# Patient Record
Sex: Male | Born: 1960
Health system: Southern US, Community
[De-identification: ages and names within clinical notes are randomized; demographics above are authoritative.]

## PROBLEM LIST (undated history)

## (undated) DIAGNOSIS — Z923 Personal history of irradiation: Secondary | ICD-10-CM

## (undated) DIAGNOSIS — C801 Malignant (primary) neoplasm, unspecified: Secondary | ICD-10-CM

## (undated) HISTORY — DX: Personal history of irradiation: Z92.3

## (undated) HISTORY — PX: OTHER SURGICAL HISTORY: SHX169

## (undated) HISTORY — PX: HERNIA REPAIR: SHX51

---

## 2002-04-26 ENCOUNTER — Encounter: Payer: Self-pay | Admitting: Internal Medicine

## 2005-10-14 ENCOUNTER — Emergency Department (HOSPITAL_COMMUNITY): Admission: EM | Admit: 2005-10-14 | Discharge: 2005-10-14 | Payer: Self-pay | Admitting: Family Medicine

## 2006-05-05 ENCOUNTER — Ambulatory Visit: Payer: Self-pay | Admitting: Internal Medicine

## 2006-05-13 ENCOUNTER — Ambulatory Visit: Payer: Self-pay | Admitting: Internal Medicine

## 2006-06-13 ENCOUNTER — Ambulatory Visit: Payer: Self-pay | Admitting: Internal Medicine

## 2007-02-13 ENCOUNTER — Ambulatory Visit: Payer: Self-pay | Admitting: Internal Medicine

## 2007-04-13 ENCOUNTER — Telehealth (INDEPENDENT_AMBULATORY_CARE_PROVIDER_SITE_OTHER): Payer: Self-pay | Admitting: *Deleted

## 2007-04-29 DIAGNOSIS — F411 Generalized anxiety disorder: Secondary | ICD-10-CM | POA: Insufficient documentation

## 2007-10-13 DIAGNOSIS — G4700 Insomnia, unspecified: Secondary | ICD-10-CM | POA: Insufficient documentation

## 2007-10-14 ENCOUNTER — Telehealth: Payer: Self-pay | Admitting: Internal Medicine

## 2008-01-14 ENCOUNTER — Ambulatory Visit: Payer: Self-pay | Admitting: Internal Medicine

## 2008-02-16 ENCOUNTER — Ambulatory Visit: Payer: Self-pay | Admitting: Internal Medicine

## 2008-02-16 ENCOUNTER — Telehealth: Payer: Self-pay | Admitting: Internal Medicine

## 2008-02-16 DIAGNOSIS — I881 Chronic lymphadenitis, except mesenteric: Secondary | ICD-10-CM | POA: Insufficient documentation

## 2008-02-16 DIAGNOSIS — J039 Acute tonsillitis, unspecified: Secondary | ICD-10-CM | POA: Insufficient documentation

## 2008-02-16 LAB — CONVERTED CEMR LAB
Basophils Absolute: 0 10*3/uL (ref 0.0–0.1)
Calcium: 9.4 mg/dL (ref 8.4–10.5)
Eosinophils Relative: 2.6 % (ref 0.0–5.0)
GFR calc non Af Amer: 95 mL/min
Glucose, Bld: 117 mg/dL — ABNORMAL HIGH (ref 70–99)
HCT: 45 % (ref 39.0–52.0)
MCHC: 33.5 g/dL (ref 30.0–36.0)
Monocytes Absolute: 0.6 10*3/uL (ref 0.1–1.0)
Neutro Abs: 4.2 10*3/uL (ref 1.4–7.7)
Platelets: 201 10*3/uL (ref 150–400)
RBC: 4.53 M/uL (ref 4.22–5.81)
Rapid Strep: NEGATIVE
WBC: 6.6 10*3/uL (ref 4.5–10.5)

## 2008-02-19 ENCOUNTER — Telehealth: Payer: Self-pay | Admitting: *Deleted

## 2008-02-23 ENCOUNTER — Encounter: Payer: Self-pay | Admitting: *Deleted

## 2008-02-23 ENCOUNTER — Other Ambulatory Visit: Admission: RE | Admit: 2008-02-23 | Discharge: 2008-02-23 | Payer: Self-pay | Admitting: Otolaryngology

## 2008-02-29 ENCOUNTER — Encounter: Payer: Self-pay | Admitting: Internal Medicine

## 2008-03-02 ENCOUNTER — Ambulatory Visit (HOSPITAL_BASED_OUTPATIENT_CLINIC_OR_DEPARTMENT_OTHER): Admission: RE | Admit: 2008-03-02 | Discharge: 2008-03-02 | Payer: Self-pay | Admitting: Otolaryngology

## 2008-03-02 ENCOUNTER — Encounter (INDEPENDENT_AMBULATORY_CARE_PROVIDER_SITE_OTHER): Payer: Self-pay | Admitting: Otolaryngology

## 2008-03-08 ENCOUNTER — Ambulatory Visit: Admission: RE | Admit: 2008-03-08 | Discharge: 2008-06-01 | Payer: Self-pay | Admitting: Radiation Oncology

## 2008-03-08 ENCOUNTER — Ambulatory Visit (HOSPITAL_COMMUNITY): Admission: RE | Admit: 2008-03-08 | Discharge: 2008-03-08 | Payer: Self-pay | Admitting: Otolaryngology

## 2008-03-09 ENCOUNTER — Ambulatory Visit: Payer: Self-pay | Admitting: Dentistry

## 2008-03-09 ENCOUNTER — Encounter: Payer: Self-pay | Admitting: Internal Medicine

## 2008-03-09 ENCOUNTER — Encounter: Admission: RE | Admit: 2008-03-09 | Discharge: 2008-03-09 | Payer: Self-pay | Admitting: Dentistry

## 2008-03-10 ENCOUNTER — Ambulatory Visit: Payer: Self-pay | Admitting: Oncology

## 2008-03-10 ENCOUNTER — Encounter: Payer: Self-pay | Admitting: Internal Medicine

## 2008-03-10 ENCOUNTER — Ambulatory Visit: Payer: Self-pay | Admitting: Dentistry

## 2008-03-14 ENCOUNTER — Ambulatory Visit (HOSPITAL_COMMUNITY): Admission: RE | Admit: 2008-03-14 | Discharge: 2008-03-14 | Payer: Self-pay | Admitting: Radiation Oncology

## 2008-03-19 ENCOUNTER — Ambulatory Visit (HOSPITAL_COMMUNITY): Admission: RE | Admit: 2008-03-19 | Discharge: 2008-03-19 | Payer: Self-pay | Admitting: Radiation Oncology

## 2008-03-21 ENCOUNTER — Encounter: Payer: Self-pay | Admitting: Internal Medicine

## 2008-03-31 LAB — COMPREHENSIVE METABOLIC PANEL
AST: 22 U/L (ref 0–37)
Albumin: 4.4 g/dL (ref 3.5–5.2)
BUN: 22 mg/dL (ref 6–23)
CO2: 24 mEq/L (ref 19–32)
Calcium: 9.1 mg/dL (ref 8.4–10.5)
Chloride: 107 mEq/L (ref 96–112)
Glucose, Bld: 91 mg/dL (ref 70–99)
Potassium: 4.3 mEq/L (ref 3.5–5.3)

## 2008-03-31 LAB — CBC WITH DIFFERENTIAL/PLATELET
Basophils Absolute: 0 10*3/uL (ref 0.0–0.1)
EOS%: 9.4 % — ABNORMAL HIGH (ref 0.0–7.0)
HCT: 40.1 % (ref 38.7–49.9)
HGB: 13.8 g/dL (ref 13.0–17.1)
LYMPH%: 23.3 % (ref 14.0–48.0)
MCH: 32.4 pg (ref 28.0–33.4)
MCHC: 34.5 g/dL (ref 32.0–35.9)
MCV: 93.9 fL (ref 81.6–98.0)
MONO%: 9.7 % (ref 0.0–13.0)
NEUT%: 57.1 % (ref 40.0–75.0)

## 2008-04-02 LAB — CREATININE CLEARANCE, URINE, 24 HOUR
Collection Interval-CRCL: 24 hours
Creatinine: 0.84 mg/dL (ref 0.40–1.50)
Urine Total Volume-CRCL: 3000 mL

## 2008-04-04 ENCOUNTER — Encounter: Payer: Self-pay | Admitting: Internal Medicine

## 2008-04-07 ENCOUNTER — Encounter: Payer: Self-pay | Admitting: Internal Medicine

## 2008-04-07 ENCOUNTER — Other Ambulatory Visit: Admission: RE | Admit: 2008-04-07 | Discharge: 2008-04-07 | Payer: Self-pay | Admitting: Interventional Radiology

## 2008-04-07 ENCOUNTER — Encounter (INDEPENDENT_AMBULATORY_CARE_PROVIDER_SITE_OTHER): Payer: Self-pay | Admitting: Interventional Radiology

## 2008-04-07 ENCOUNTER — Encounter: Admission: RE | Admit: 2008-04-07 | Discharge: 2008-04-07 | Payer: Self-pay | Admitting: Oncology

## 2008-04-08 ENCOUNTER — Telehealth: Payer: Self-pay | Admitting: Internal Medicine

## 2008-04-11 LAB — CBC WITH DIFFERENTIAL/PLATELET
Eosinophils Absolute: 0.1 10*3/uL (ref 0.0–0.5)
HCT: 39 % (ref 38.7–49.9)
HGB: 13.5 g/dL (ref 13.0–17.1)
LYMPH%: 15.5 % (ref 14.0–48.0)
MONO#: 0.8 10*3/uL (ref 0.1–0.9)
NEUT#: 3.8 10*3/uL (ref 1.5–6.5)
NEUT%: 68 % (ref 40.0–75.0)
Platelets: 146 10*3/uL (ref 145–400)
RBC: 4.06 10*6/uL — ABNORMAL LOW (ref 4.20–5.71)
WBC: 5.6 10*3/uL (ref 4.0–10.0)

## 2008-04-11 LAB — BASIC METABOLIC PANEL
CO2: 23 mEq/L (ref 19–32)
Calcium: 9.1 mg/dL (ref 8.4–10.5)
Glucose, Bld: 103 mg/dL — ABNORMAL HIGH (ref 70–99)
Sodium: 135 mEq/L (ref 135–145)

## 2008-04-12 ENCOUNTER — Ambulatory Visit (HOSPITAL_COMMUNITY): Admission: RE | Admit: 2008-04-12 | Discharge: 2008-04-12 | Payer: Self-pay | Admitting: Oncology

## 2008-04-18 ENCOUNTER — Encounter: Payer: Self-pay | Admitting: Internal Medicine

## 2008-04-18 LAB — CBC WITH DIFFERENTIAL/PLATELET
Basophils Absolute: 0 10*3/uL (ref 0.0–0.1)
EOS%: 1.7 % (ref 0.0–7.0)
HGB: 12.4 g/dL — ABNORMAL LOW (ref 13.0–17.1)
LYMPH%: 15 % (ref 14.0–48.0)
MCH: 33.7 pg — ABNORMAL HIGH (ref 28.0–33.4)
MCV: 95.6 fL (ref 81.6–98.0)
MONO%: 10.4 % (ref 0.0–13.0)
NEUT%: 72.4 % (ref 40.0–75.0)
RDW: 12.8 % (ref 11.2–14.6)

## 2008-04-18 LAB — BASIC METABOLIC PANEL
BUN: 22 mg/dL (ref 6–23)
Calcium: 9.2 mg/dL (ref 8.4–10.5)
Creatinine, Ser: 0.92 mg/dL (ref 0.40–1.50)
Potassium: 4.4 mEq/L (ref 3.5–5.3)

## 2008-04-25 ENCOUNTER — Ambulatory Visit: Payer: Self-pay | Admitting: Oncology

## 2008-04-25 LAB — MAGNESIUM: Magnesium: 2.3 mg/dL (ref 1.5–2.5)

## 2008-04-25 LAB — CBC WITH DIFFERENTIAL/PLATELET
BASO%: 1.1 % (ref 0.0–2.0)
LYMPH%: 5.4 % — ABNORMAL LOW (ref 14.0–48.0)
MCHC: 34.9 g/dL (ref 32.0–35.9)
MCV: 93.7 fL (ref 81.6–98.0)
MONO%: 8.2 % (ref 0.0–13.0)
Platelets: 145 10*3/uL (ref 145–400)
RBC: 4.07 10*6/uL — ABNORMAL LOW (ref 4.20–5.71)

## 2008-04-25 LAB — COMPREHENSIVE METABOLIC PANEL
ALT: 25 U/L (ref 0–53)
AST: 23 U/L (ref 0–37)
CO2: 29 mEq/L (ref 19–32)
Creatinine, Ser: 0.87 mg/dL (ref 0.40–1.50)
Total Bilirubin: 0.8 mg/dL (ref 0.3–1.2)

## 2008-04-29 ENCOUNTER — Ambulatory Visit: Payer: Self-pay | Admitting: Oncology

## 2008-04-29 ENCOUNTER — Inpatient Hospital Stay (HOSPITAL_COMMUNITY): Admission: EM | Admit: 2008-04-29 | Discharge: 2008-05-01 | Payer: Self-pay | Admitting: Oncology

## 2008-04-29 LAB — COMPREHENSIVE METABOLIC PANEL
ALT: 29 U/L (ref 0–53)
Albumin: 3.1 g/dL — ABNORMAL LOW (ref 3.5–5.2)
CO2: 25 mEq/L (ref 19–32)
Calcium: 8.8 mg/dL (ref 8.4–10.5)
Chloride: 101 mEq/L (ref 96–112)
Creatinine, Ser: 0.93 mg/dL (ref 0.40–1.50)
Potassium: 3.8 mEq/L (ref 3.5–5.3)

## 2008-04-29 LAB — MAGNESIUM: Magnesium: 2 mg/dL (ref 1.5–2.5)

## 2008-04-30 ENCOUNTER — Ambulatory Visit: Payer: Self-pay | Admitting: Oncology

## 2008-05-01 ENCOUNTER — Ambulatory Visit: Payer: Self-pay | Admitting: Oncology

## 2008-05-02 ENCOUNTER — Encounter: Payer: Self-pay | Admitting: Internal Medicine

## 2008-05-02 LAB — CBC WITH DIFFERENTIAL/PLATELET
Basophils Absolute: 0 10*3/uL (ref 0.0–0.1)
Eosinophils Absolute: 0.1 10*3/uL (ref 0.0–0.5)
HGB: 12.7 g/dL — ABNORMAL LOW (ref 13.0–17.1)
MCV: 94.3 fL (ref 81.6–98.0)
MONO%: 7.5 % (ref 0.0–13.0)
NEUT#: 2.9 10*3/uL (ref 1.5–6.5)
RBC: 3.79 10*6/uL — ABNORMAL LOW (ref 4.20–5.71)
RDW: 12.5 % (ref 11.2–14.6)
WBC: 3.4 10*3/uL — ABNORMAL LOW (ref 4.0–10.0)
lymph#: 0.2 10*3/uL — ABNORMAL LOW (ref 0.9–3.3)

## 2008-05-02 LAB — BASIC METABOLIC PANEL
BUN: 21 mg/dL (ref 6–23)
Chloride: 98 mEq/L (ref 96–112)
Glucose, Bld: 96 mg/dL (ref 70–99)
Potassium: 3.6 mEq/L (ref 3.5–5.3)
Sodium: 134 mEq/L — ABNORMAL LOW (ref 135–145)

## 2008-05-02 LAB — MAGNESIUM: Magnesium: 1.6 mg/dL (ref 1.5–2.5)

## 2008-05-06 LAB — CBC WITH DIFFERENTIAL/PLATELET
Basophils Absolute: 0 10*3/uL (ref 0.0–0.1)
HCT: 33.5 % — ABNORMAL LOW (ref 38.7–49.9)
HGB: 11.8 g/dL — ABNORMAL LOW (ref 13.0–17.1)
LYMPH%: 6.2 % — ABNORMAL LOW (ref 14.0–48.0)
MCH: 33.8 pg — ABNORMAL HIGH (ref 28.0–33.4)
MONO#: 0.6 10*3/uL (ref 0.1–0.9)
NEUT%: 78.1 % — ABNORMAL HIGH (ref 40.0–75.0)
Platelets: 78 10*3/uL — ABNORMAL LOW (ref 145–400)
WBC: 3.9 10*3/uL — ABNORMAL LOW (ref 4.0–10.0)
lymph#: 0.2 10*3/uL — ABNORMAL LOW (ref 0.9–3.3)

## 2008-05-10 ENCOUNTER — Encounter: Payer: Self-pay | Admitting: Internal Medicine

## 2008-05-13 LAB — CBC WITH DIFFERENTIAL/PLATELET
BASO%: 0.1 % (ref 0.0–2.0)
Basophils Absolute: 0 10*3/uL (ref 0.0–0.1)
Eosinophils Absolute: 0 10*3/uL (ref 0.0–0.5)
HCT: 32.2 % — ABNORMAL LOW (ref 38.7–49.9)
HGB: 11.4 g/dL — ABNORMAL LOW (ref 13.0–17.1)
MONO#: 0.2 10*3/uL (ref 0.1–0.9)
NEUT#: 1 10*3/uL — ABNORMAL LOW (ref 1.5–6.5)
NEUT%: 70.5 % (ref 40.0–75.0)
WBC: 1.4 10*3/uL — ABNORMAL LOW (ref 4.0–10.0)
lymph#: 0.1 10*3/uL — ABNORMAL LOW (ref 0.9–3.3)

## 2008-05-13 LAB — COMPREHENSIVE METABOLIC PANEL
ALT: 28 U/L (ref 0–53)
BUN: 24 mg/dL — ABNORMAL HIGH (ref 6–23)
CO2: 25 mEq/L (ref 19–32)
Calcium: 9.1 mg/dL (ref 8.4–10.5)
Chloride: 103 mEq/L (ref 96–112)
Creatinine, Ser: 0.83 mg/dL (ref 0.40–1.50)

## 2008-05-13 LAB — MAGNESIUM: Magnesium: 2.1 mg/dL (ref 1.5–2.5)

## 2008-05-17 LAB — CBC WITH DIFFERENTIAL/PLATELET
BASO%: 0.9 % (ref 0.0–2.0)
Basophils Absolute: 0 10*3/uL (ref 0.0–0.1)
EOS%: 3.9 % (ref 0.0–7.0)
HGB: 12.9 g/dL — ABNORMAL LOW (ref 13.0–17.1)
MCH: 33.3 pg (ref 28.0–33.4)
RDW: 12.1 % (ref 11.2–14.6)
lymph#: 0.1 10*3/uL — ABNORMAL LOW (ref 0.9–3.3)

## 2008-05-19 LAB — CBC WITH DIFFERENTIAL/PLATELET
Basophils Absolute: 0 10*3/uL (ref 0.0–0.1)
Eosinophils Absolute: 0 10*3/uL (ref 0.0–0.5)
HGB: 11 g/dL — ABNORMAL LOW (ref 13.0–17.1)
MCV: 93.1 fL (ref 81.6–98.0)
MONO#: 0.5 10*3/uL (ref 0.1–0.9)
NEUT#: 0.8 10*3/uL — ABNORMAL LOW (ref 1.5–6.5)
RDW: 12.1 % (ref 11.2–14.6)
WBC: 1.5 10*3/uL — ABNORMAL LOW (ref 4.0–10.0)
lymph#: 0.1 10*3/uL — ABNORMAL LOW (ref 0.9–3.3)

## 2008-05-23 LAB — BASIC METABOLIC PANEL
CO2: 31 mEq/L (ref 19–32)
Calcium: 9 mg/dL (ref 8.4–10.5)
Chloride: 99 mEq/L (ref 96–112)
Creatinine, Ser: 0.88 mg/dL (ref 0.40–1.50)
Glucose, Bld: 94 mg/dL (ref 70–99)
Sodium: 138 mEq/L (ref 135–145)

## 2008-05-23 LAB — CBC WITH DIFFERENTIAL/PLATELET
Basophils Absolute: 0 10*3/uL (ref 0.0–0.1)
Eosinophils Absolute: 0.1 10*3/uL (ref 0.0–0.5)
HGB: 11.7 g/dL — ABNORMAL LOW (ref 13.0–17.1)
LYMPH%: 5.5 % — ABNORMAL LOW (ref 14.0–48.0)
MCV: 92.5 fL (ref 81.6–98.0)
MONO#: 0.7 10*3/uL (ref 0.1–0.9)
MONO%: 24.3 % — ABNORMAL HIGH (ref 0.0–13.0)
NEUT#: 2 10*3/uL (ref 1.5–6.5)
Platelets: 283 10*3/uL (ref 145–400)

## 2008-05-25 ENCOUNTER — Encounter: Payer: Self-pay | Admitting: Internal Medicine

## 2008-06-02 LAB — CBC WITH DIFFERENTIAL/PLATELET
BASO%: 0.9 % (ref 0.0–2.0)
Basophils Absolute: 0 10*3/uL (ref 0.0–0.1)
EOS%: 0.1 % (ref 0.0–7.0)
HCT: 34.2 % — ABNORMAL LOW (ref 38.7–49.9)
MCH: 33.3 pg (ref 28.0–33.4)
MCHC: 34.8 g/dL (ref 32.0–35.9)
MCV: 95.7 fL (ref 81.6–98.0)
MONO%: 14 % — ABNORMAL HIGH (ref 0.0–13.0)
NEUT%: 80.9 % — ABNORMAL HIGH (ref 40.0–75.0)
lymph#: 0.2 10*3/uL — ABNORMAL LOW (ref 0.9–3.3)

## 2008-06-09 ENCOUNTER — Ambulatory Visit: Payer: Self-pay | Admitting: Oncology

## 2008-06-13 ENCOUNTER — Encounter: Payer: Self-pay | Admitting: Internal Medicine

## 2008-06-13 LAB — CBC WITH DIFFERENTIAL/PLATELET
BASO%: 0.4 % (ref 0.0–2.0)
HCT: 33.3 % — ABNORMAL LOW (ref 38.7–49.9)
MCHC: 34.7 g/dL (ref 32.0–35.9)
MONO#: 0.6 10*3/uL (ref 0.1–0.9)
RBC: 3.45 10*6/uL — ABNORMAL LOW (ref 4.20–5.71)
WBC: 4.1 10*3/uL (ref 4.0–10.0)
lymph#: 0.2 10*3/uL — ABNORMAL LOW (ref 0.9–3.3)

## 2008-07-14 ENCOUNTER — Encounter: Payer: Self-pay | Admitting: Internal Medicine

## 2008-07-29 ENCOUNTER — Ambulatory Visit: Payer: Self-pay | Admitting: Oncology

## 2008-07-29 ENCOUNTER — Encounter: Payer: Self-pay | Admitting: Internal Medicine

## 2008-08-11 ENCOUNTER — Ambulatory Visit (HOSPITAL_COMMUNITY): Admission: RE | Admit: 2008-08-11 | Discharge: 2008-08-11 | Payer: Self-pay | Admitting: Radiation Oncology

## 2008-08-17 ENCOUNTER — Ambulatory Visit (HOSPITAL_COMMUNITY): Admission: RE | Admit: 2008-08-17 | Discharge: 2008-08-17 | Payer: Self-pay | Admitting: Radiation Oncology

## 2008-08-24 ENCOUNTER — Encounter: Payer: Self-pay | Admitting: Internal Medicine

## 2008-08-30 ENCOUNTER — Ambulatory Visit: Payer: Self-pay | Admitting: Dentistry

## 2008-08-31 ENCOUNTER — Encounter: Payer: Self-pay | Admitting: Internal Medicine

## 2008-10-12 ENCOUNTER — Encounter: Payer: Self-pay | Admitting: Internal Medicine

## 2008-10-17 ENCOUNTER — Encounter: Payer: Self-pay | Admitting: Internal Medicine

## 2008-10-17 ENCOUNTER — Ambulatory Visit: Payer: Self-pay | Admitting: Cardiothoracic Surgery

## 2009-02-22 ENCOUNTER — Ambulatory Visit (HOSPITAL_COMMUNITY): Admission: RE | Admit: 2009-02-22 | Discharge: 2009-02-22 | Payer: Self-pay | Admitting: Radiation Oncology

## 2009-05-05 ENCOUNTER — Emergency Department (HOSPITAL_COMMUNITY): Admission: EM | Admit: 2009-05-05 | Discharge: 2009-05-05 | Payer: Self-pay | Admitting: Emergency Medicine

## 2009-07-05 ENCOUNTER — Ambulatory Visit: Admission: RE | Admit: 2009-07-05 | Discharge: 2009-07-05 | Payer: Self-pay | Admitting: Radiation Oncology

## 2009-07-13 ENCOUNTER — Encounter (INDEPENDENT_AMBULATORY_CARE_PROVIDER_SITE_OTHER): Payer: Self-pay | Admitting: Cardiovascular Disease

## 2009-07-13 ENCOUNTER — Observation Stay (HOSPITAL_COMMUNITY): Admission: EM | Admit: 2009-07-13 | Discharge: 2009-07-13 | Payer: Self-pay | Admitting: Emergency Medicine

## 2009-12-08 ENCOUNTER — Ambulatory Visit (HOSPITAL_BASED_OUTPATIENT_CLINIC_OR_DEPARTMENT_OTHER): Admission: RE | Admit: 2009-12-08 | Discharge: 2009-12-08 | Payer: Self-pay | Admitting: Surgery

## 2010-09-30 ENCOUNTER — Encounter: Payer: Self-pay | Admitting: Cardiothoracic Surgery

## 2010-09-30 ENCOUNTER — Encounter: Payer: Self-pay | Admitting: Internal Medicine

## 2010-10-01 ENCOUNTER — Encounter: Payer: Self-pay | Admitting: Radiation Oncology

## 2010-10-03 IMAGING — CT CT HEAD W/O CM
1 series · 16 of 30 positions shown, 20 images · non-contrast
Comparison: 05/05/2009.

CLINICAL DATA: New onset atrial fibrillation.

CT HEAD WITHOUT CONTRAST
TECHNIQUE: Contiguous axial images were obtained from the base of
the skull through the vertex without contrast.

[Series 2: head routine 4.8 h37s · axial · 0.50mm/px · z∈[+1263,+1421]mm · 16 of 36 slices shown, 20 images]
[im 2/36  brain]
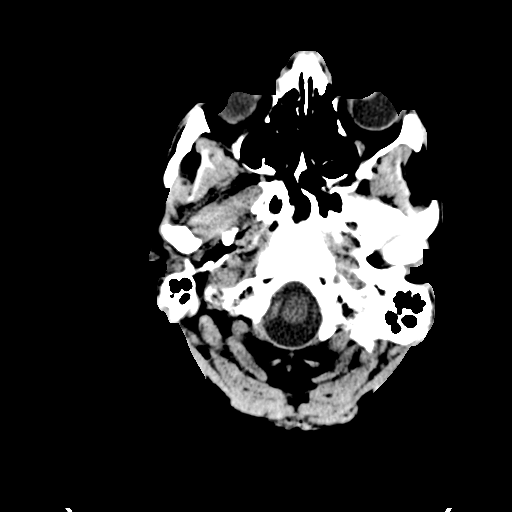
[im 2/36  bone]
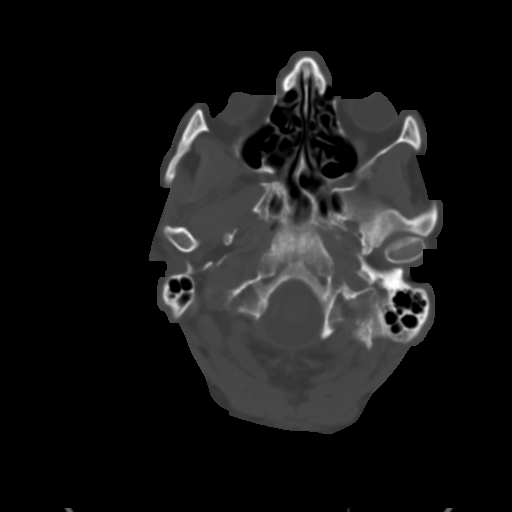
[im 4/36  brain]
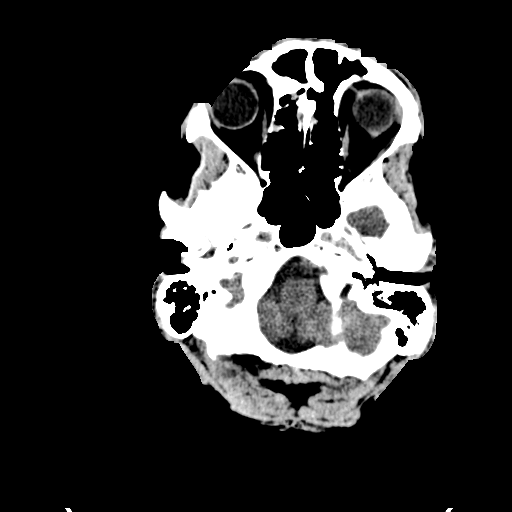
[im 7/36  brain]
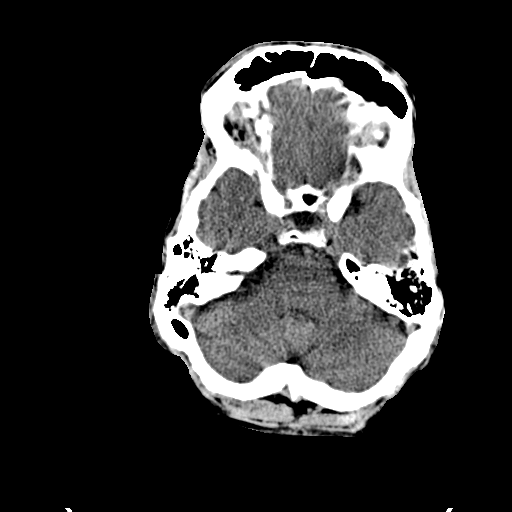
[im 9/36  brain]
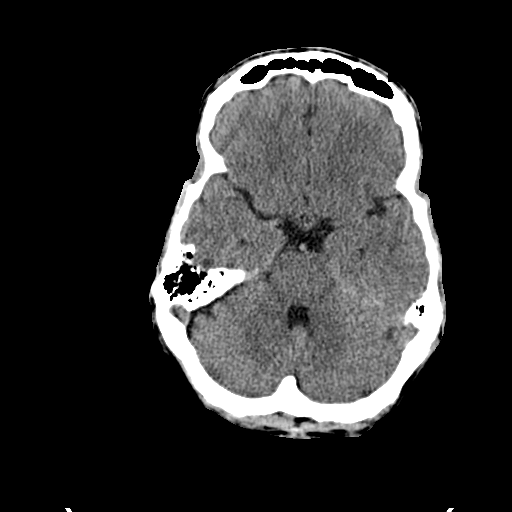
[im 10/36  brain]
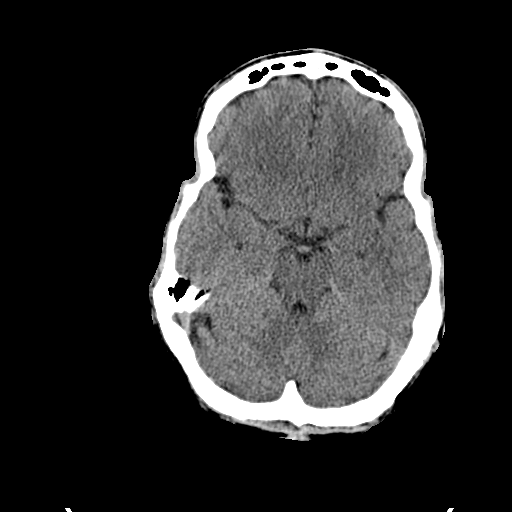
[im 10/36  bone]
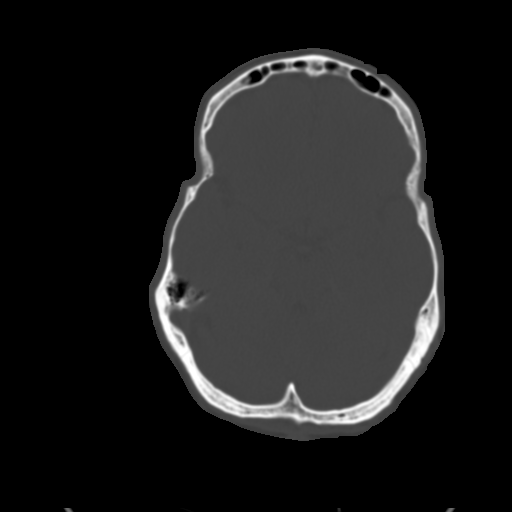
[im 13/36  brain]
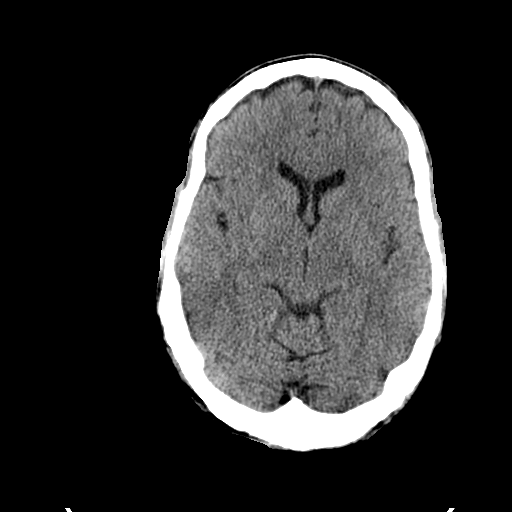
[im 15/36  brain]
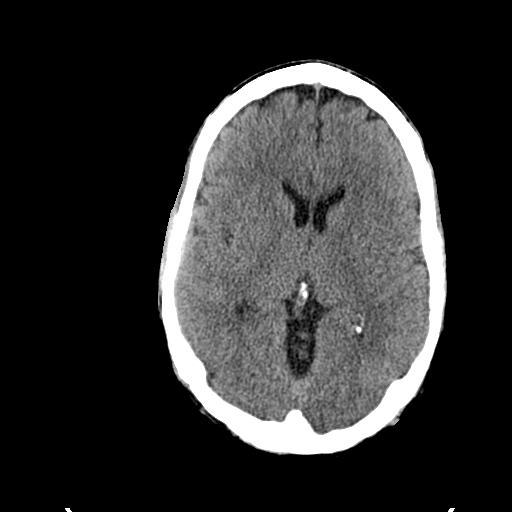
[im 17/36  brain]
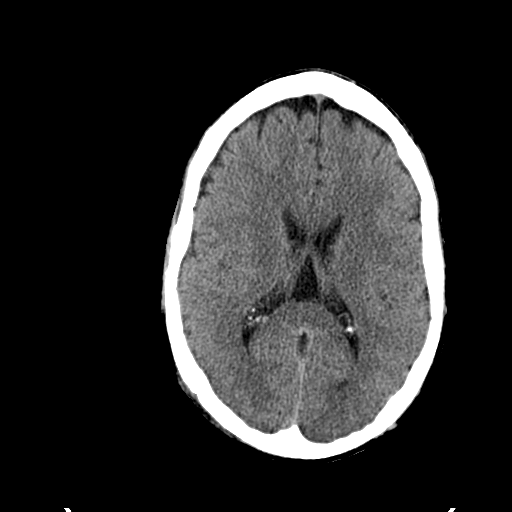
[im 19/36  brain]
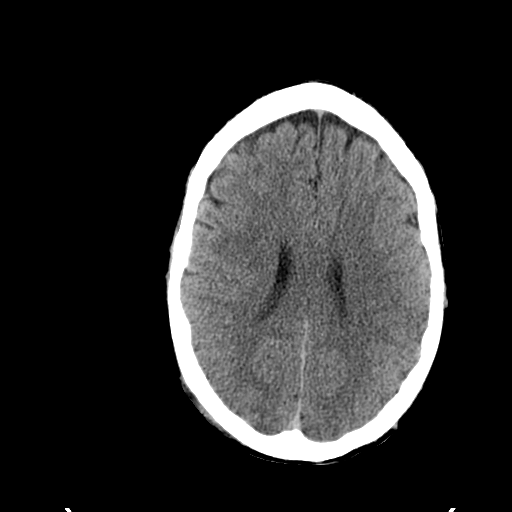
[im 19/36  bone]
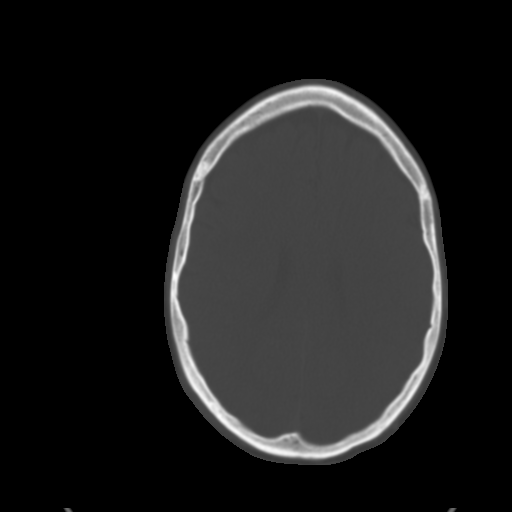
[im 21/36  brain]
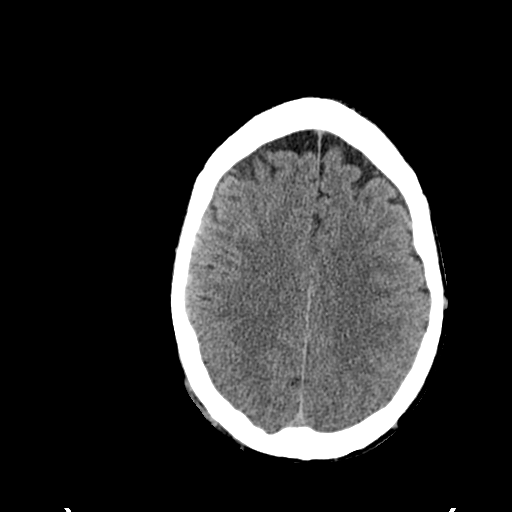
[im 23/36  brain]
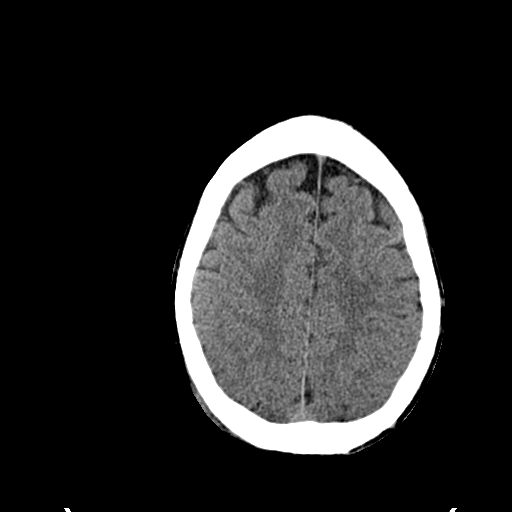
[im 26/36  brain]
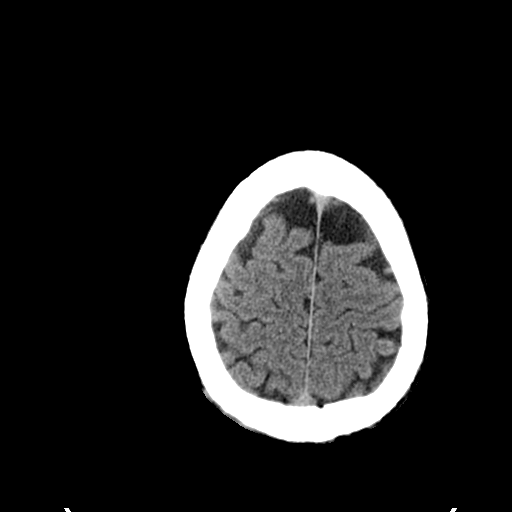
[im 27/36  brain]
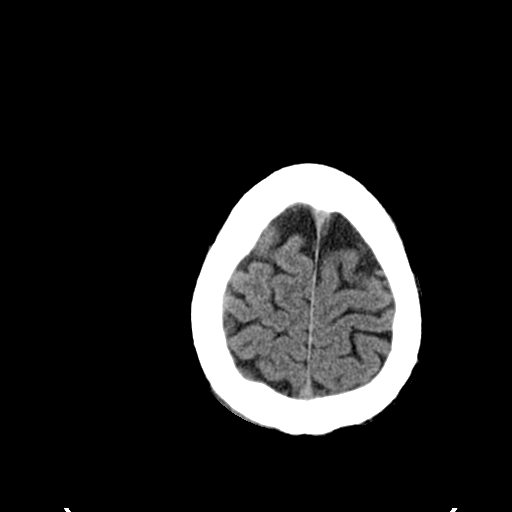
[im 27/36  bone]
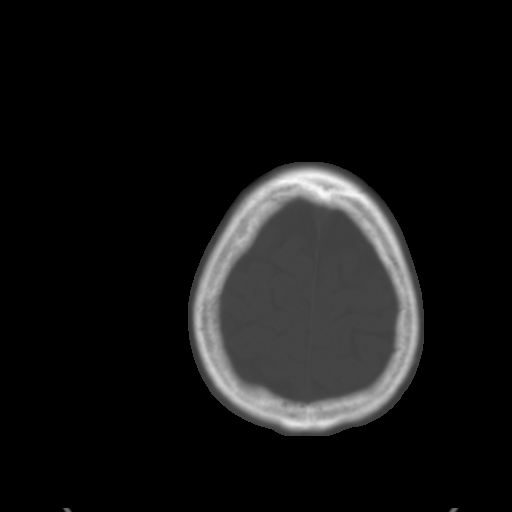
[im 29/36  brain]
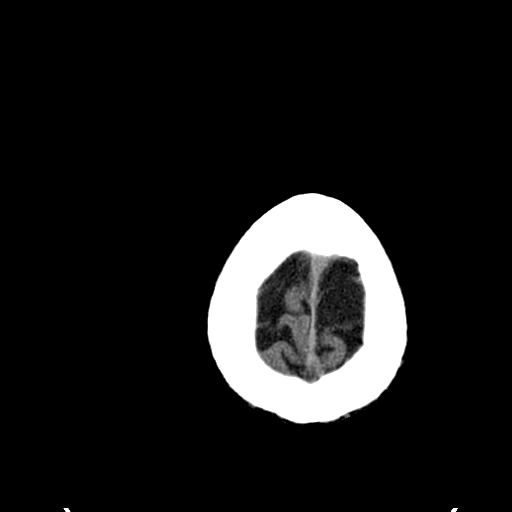
[im 32/36  brain]
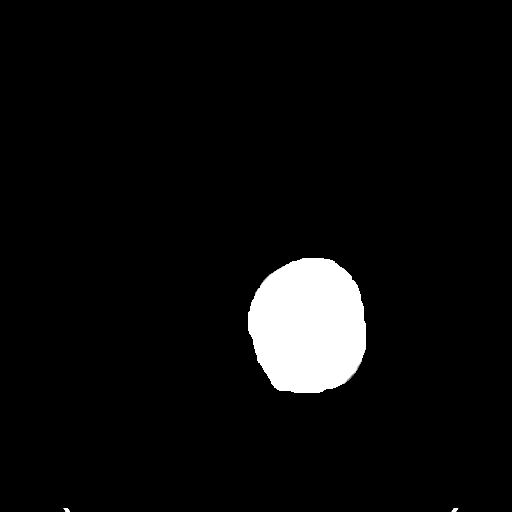
[im 34/36  brain]
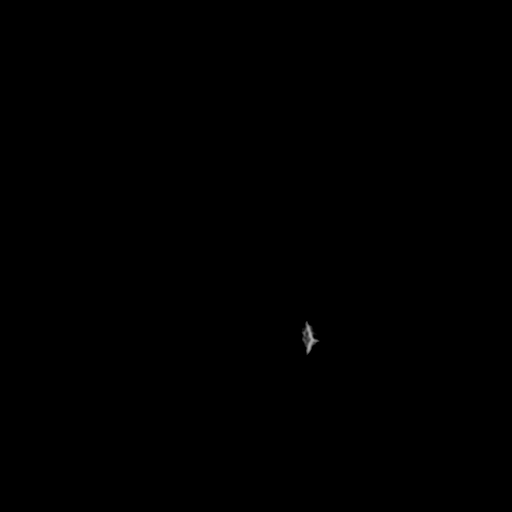

[16 of 30 positions shown; findings below may reference images not displayed]

FINDINGS: Ventricle size is normal.  Negative for intracranial
hemorrhage.  There is no infarct or mass lesion.  There is no brain
edema or midline shift.  Skull is normal.
IMPRESSION: Negative

## 2010-10-03 IMAGING — CR DG CHEST 2V
2 series · 2 of 2 positions shown · non-contrast
Comparison: CT of 03/14/2008.  P E T of 08/17/2008.

CLINICAL DATA: Fall.  Hematoma.  Mid chest pain.  Syncope.

CHEST - 2 VIEW

[w chest pa]
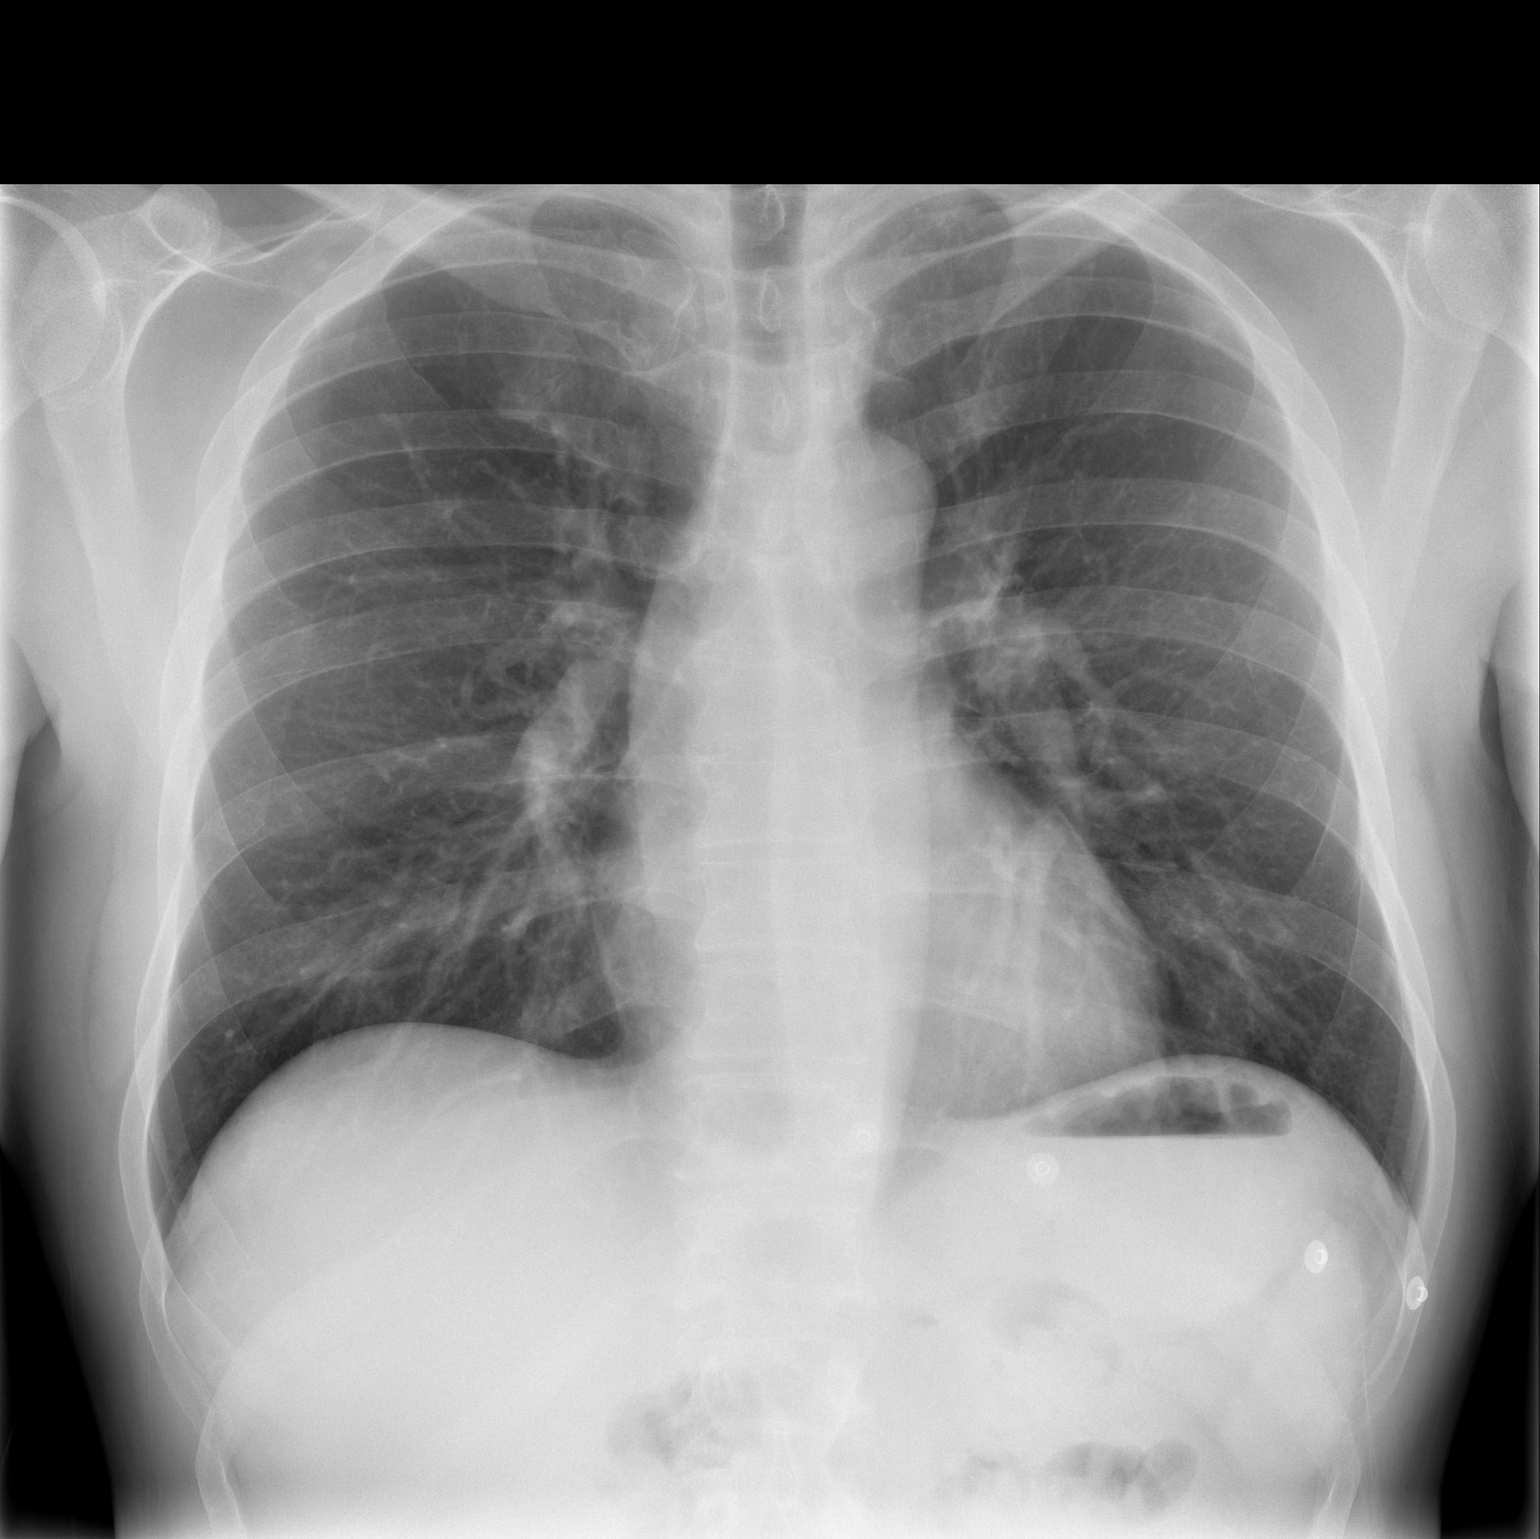

[w chest lat]
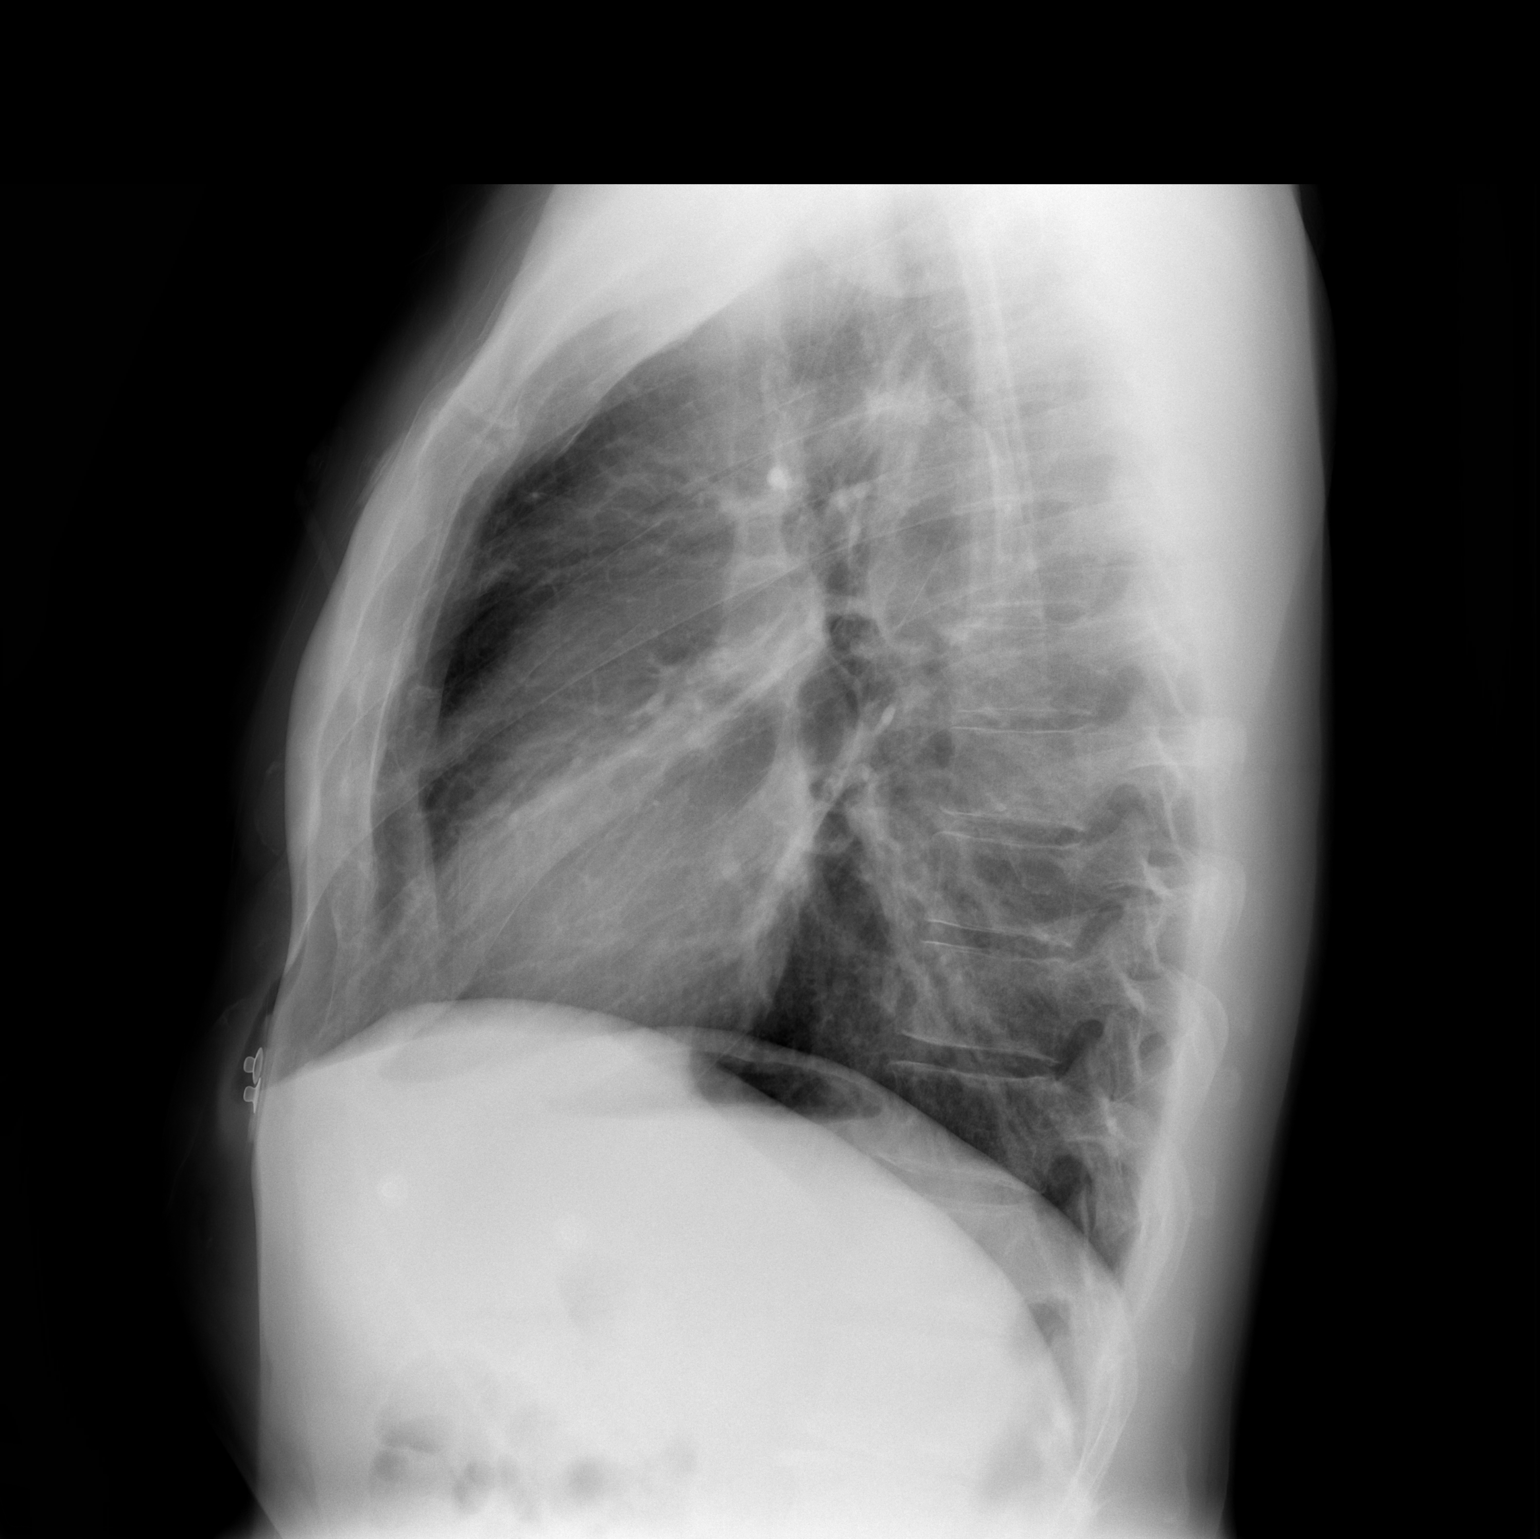

[2 of 2 positions shown; findings below may reference images not displayed]

FINDINGS: Mild pectus excavatum deformity. Midline trachea.  Normal
heart size and mediastinal contours. No pleural effusion or
pneumothorax.  Clear lungs.
IMPRESSION: 1. No acute cardiopulmonary disease.
2.  Please note that per the report of the 08/17/2008 PET, the
patient has a history of squamous cell carcinoma of the neck.

## 2010-11-21 ENCOUNTER — Other Ambulatory Visit: Payer: Self-pay | Admitting: Dermatology

## 2010-11-28 LAB — POCT HEMOGLOBIN-HEMACUE: Hemoglobin: 14.8 g/dL (ref 13.0–17.0)

## 2010-12-12 LAB — ETHANOL: Alcohol, Ethyl (B): 14 mg/dL — ABNORMAL HIGH (ref 0–10)

## 2010-12-12 LAB — URINALYSIS, ROUTINE W REFLEX MICROSCOPIC
Bilirubin Urine: NEGATIVE
Glucose, UA: NEGATIVE mg/dL
Hgb urine dipstick: NEGATIVE
Nitrite: NEGATIVE
Protein, ur: NEGATIVE mg/dL
Specific Gravity, Urine: 1.011 (ref 1.005–1.030)

## 2010-12-12 LAB — CBC
HCT: 39.9 % (ref 39.0–52.0)
MCHC: 35.3 g/dL (ref 30.0–36.0)
MCV: 100.3 fL — ABNORMAL HIGH (ref 78.0–100.0)
MCV: 99.1 fL (ref 78.0–100.0)
RBC: 3.98 MIL/uL — ABNORMAL LOW (ref 4.22–5.81)
RBC: 4.06 MIL/uL — ABNORMAL LOW (ref 4.22–5.81)
RDW: 13.2 % (ref 11.5–15.5)
WBC: 3.7 10*3/uL — ABNORMAL LOW (ref 4.0–10.5)
WBC: 4.9 10*3/uL (ref 4.0–10.5)

## 2010-12-12 LAB — MAGNESIUM: Magnesium: 1.5 mg/dL (ref 1.5–2.5)

## 2010-12-12 LAB — BASIC METABOLIC PANEL
BUN: 14 mg/dL (ref 6–23)
CO2: 23 mEq/L (ref 19–32)
Chloride: 107 mEq/L (ref 96–112)
GFR calc Af Amer: >60 mL/min (ref >60)
Potassium: 3.3 mEq/L — ABNORMAL LOW (ref 3.5–5.1)

## 2010-12-12 LAB — COMPREHENSIVE METABOLIC PANEL
ALT: 23 U/L (ref 0–53)
AST: 23 U/L (ref 0–37)
Alkaline Phosphatase: 45 U/L (ref 39–117)
CO2: 23 mEq/L (ref 19–32)
GFR calc Af Amer: >60 mL/min (ref >60)
Glucose, Bld: 96 mg/dL (ref 70–99)
Potassium: 3.9 mEq/L (ref 3.5–5.1)
Sodium: 137 mEq/L (ref 135–145)
Total Protein: 6 g/dL (ref 6.0–8.3)

## 2010-12-12 LAB — DIFFERENTIAL
Basophils Absolute: 0 10*3/uL (ref 0.0–0.1)
Basophils Relative: 0 % (ref 0–1)
Eosinophils Absolute: 0.1 10*3/uL (ref 0.0–0.7)
Eosinophils Absolute: 0.1 10*3/uL (ref 0.0–0.7)
Eosinophils Relative: 2 % (ref 0–5)
Eosinophils Relative: 2 % (ref 0–5)
Lymphocytes Relative: 16 % (ref 12–46)
Lymphs Abs: 0.6 10*3/uL — ABNORMAL LOW (ref 0.7–4.0)
Monocytes Relative: 12 % (ref 3–12)

## 2010-12-12 LAB — PROTIME-INR
INR: 1.1 (ref 0.00–1.49)
Prothrombin Time: 13 seconds (ref 11.6–15.2)

## 2010-12-12 LAB — RAPID URINE DRUG SCREEN, HOSP PERFORMED
Barbiturates: NOT DETECTED
Opiates: NOT DETECTED

## 2010-12-18 ENCOUNTER — Other Ambulatory Visit: Payer: Self-pay | Admitting: Dermatology

## 2010-12-25 ENCOUNTER — Other Ambulatory Visit: Payer: Self-pay | Admitting: Dermatology

## 2011-01-03 ENCOUNTER — Other Ambulatory Visit: Payer: Self-pay | Admitting: Dermatology

## 2011-01-22 NOTE — H&P (Signed)
NAMEMANDEEP, KISER NO.:  [id number]   MEDICAL RECORD NO.:  [id number]          PATIENT TYPE:  INP   LOCATION:                               FACILITY:  [facility]   PHYSICIAN:  Leighton Roach. Truett Perna, M.D. DATE OF BIRTH:  1960/11/10   DATE OF ADMISSION:  04/29/2008  DATE OF DISCHARGE:  05/01/2008                              HISTORY & PHYSICAL   PATIENT IDENTIFICATION:  Mr. Clarence Stark is a 50 year old with a base of  the tongue carcinoma.  He is being treated with concurrent cisplatin  chemotherapy and radiation.  He is now admitted with intractable nausea  following cisplatin chemotherapy given on August 17.   HISTORY OF PRESENT ILLNESS:  Mr. 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  He is  currently being treated with concurrent cisplatin chemotherapy and  radiation.   He developed protracted nausea following the first cycle of cisplatin  chemotherapy.  He had nausea for 5-7 days despite Reglan/Decadron,  Compazine, and Ativan.  He received Aloxi and Decadron premedication  prior to chemotherapy.   He was treated with cycle #2 of cisplatin chemotherapy on August 17.  He  again received Aloxi/Decadron premedication, and in addition an Emend  tri-pack.  Despite these antiemetics he has developed severe nausea over  the past few days.  He has received intravenous fluids and antiemetics  in the chemotherapy room for the past 3 days.  He continues to have  severe nausea.  He has been unable to tolerate liquids or tube feedings  due to nausea.   He has been treated with Ativan, Phenergan, Zofran, Decadron/Reglan, and  intravenous fluids.   He is now admitted for intravenous hydration and continued antiemetic  support.   PAST MEDICAL HISTORY:  1. History of multiple skin cancers.  2. Status post right knee surgery.   CURRENT MEDICATIONS:  1. Phenergan 12.5 mg per G-tube q.6 h. p.r.n.  2. Ativan 0.5 to 1 mg  sublingual q.6 h. p.r.n.  3. Oxycodone solution 5 mg q.4 h. p.r.n.  4. Decadron 12 mg daily.  5. Lortab elixir 7.5 mg p.r.n.  6. Reglan 10 mg per G-tube q.a.c.  7. Zofran ODT 8 mg q.12 h. p.r.n.  8. Tylenol 325 mg p.r.n.  9. Restoril 30 mg q.h.s. p.r.n.   ALLERGIES:  NO KNOWN DRUG ALLERGIES.   FAMILY HISTORY:  Noncontributory.   SOCIAL HISTORY:  Lives with wife in [place].  He does not use  tobacco.  He is a moderate alcohol user.   REVIEW OF SYSTEMS:  CONSTITUTIONAL:  He denies fever.  RESPIRATORY:  Negative.  GU:  He is passing urine.  GI:  As per HPI.  No emesis.  He  had one episode of dry heaves.  He had a bowel movement earlier today.   PHYSICAL EXAMINATION:  HEENT:  No thrush.  There are small ulcerations  at the palate and buccal mucosa.  LUNGS:  Clear bilaterally.  CARDIAC:  Regular rhythm.  ABDOMEN:  Soft and nontender.  Hypoactive bowel sounds.  G-tube site  without evidence of infection.  EXTREMITIES: No edema.  The skin turgor is not diminished.  NEUROLOGIC:  He is alert.   LABORATORY DATA:  Labs from August 21 at 9:00 a.m.:  BUN 27, creatinine  0.93, total protein 5.9, albumin 3.1, calcium 8.8, magnesium 2.0,  potassium 3.8, sodium 134.   IMPRESSION:  1. 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  2. Right thyroid nodule - status post an FNA biopsy on April 07, 2008      with no metastatic carcinoma identified.  3. History of a small hypermetabolic lymph node in the left neck - ?      Significance.  4. History of multiple skin cancers.  5. Extensive family history of skin cancer.  6. Status post right knee surgery.  7. Intractable nausea/vomiting following cisplatin chemotherapy given      on August 17.   Mr. Clarence Stark has persistent severe nausea despite multiple antiemetics  and intravenous fluids given  over the past several days.  He will be  admitted for continued intravenous hydration and antiemetic support.   I discussed the case with Dr. Mitzi Hansen.  We will hold the scheduled  radiation today.      Leighton Roach Truett Perna, M.D.  Electronically Signed     GBS/MEDQ  D:  04/29/2008  T:  04/29/2008  Job:  [id number]   cc:   Maryln Gottron, M.D.  Fax: [phone]

## 2011-01-22 NOTE — Op Note (Signed)
NAMEJAYMES, HANG              ACCOUNT NO.:  [id number]   MEDICAL RECORD NO.:  [id number]          PATIENT TYPE:  AMB   LOCATION:  DSC                          FACILITY:  [facility]   PHYSICIAN:  Clarence Stark, M.D.DATE OF BIRTH:  1961/07/20   DATE OF PROCEDURE:  03/02/2008  DATE OF DISCHARGE:                               OPERATIVE REPORT   PREOPERATIVE DIAGNOSIS:  Right neck lymphadenopathy.   POSTOPERATIVE DIAGNOSIS:  Right neck lymphadenopathy, neck-node biopsy  positive for squamous cell carcinoma.   OPERATIONS PERFORMED:  1. Excisional biopsy of right neck node.  2. Direct laryngoscopy and biopsy.  3. Tonsillectomy.   SURGEON:  Clarence Garbe. Ezzard Standing, MD   ANESTHESIA:  General endotracheal.   COMPLICATIONS:  None.   CLINICAL NOTE:  Clarence Stark is a 50 year old gentleman who has noted  some right enlarged neck nodes now for 5 weeks.  He has had a CT scan,  which shows pathologically enlarged right neck nodes.  Fine-needle  aspirate was performed in the office, which was nondiagnostic and was  consistent more with a benign lymphoepithelial lesion.  He is taken to  the operating room at this time for excisional biopsy of neck node,  possible direct laryngoscopy, biopsy, and tonsillectomy.   DESCRIPTION OF PROCEDURE:  After adequate endotracheal anesthesia, the  oropharynx was examined.  Clarence Stark had bilaterally symmetrically enlarged  tonsils.  Direct laryngoscopy revealed relatively clear base of tongue  the base.  The base of tongue was little lumpy, little bit more swollen  on the right side, but no discrete ulcer or discrete mass noted.  Palpation of the tonsils were enlarged, but no discrete mass within the  tonsils.  On examination of the vocal cords, vocal cords were normal.  False cords were clear.  Piriform sinuses, epiglottis, and AE folds were  all normal.  Following this, the patient's right neck was prepped with  Betadine solution and draped using  sterile towels.  An incision was made  at the inferior edge of the 2 lymph nodes.  Dissection was carried down  through the platysma muscle.  The lymph nodes were just at anterior  border of the sternocleidomastoid muscle.  They were dissected out with  the first node sent for frozen section and the next node sent for  permanent section.  Hemostasis was obtained with bipolar cautery.  The  wound was closed with interrupted 3-0 chromic sutures subcutaneously and  5-0 nylon to reapproximate skin edges.  The frozen section on the first  node came back positive for squamous cell carcinoma.  Repeat direct  laryngoscopy and biopsies were performed.  The base of tongue was  biopsied on the right side of the base of tongue in 2 different  positions.  The nasopharynx was evaluated.  The nasopharynx was clear  with no significant adenoid tissue noted.  The tonsils which were both  symmetrically enlarged were removed bilaterally as they were both  enlarged and slightly firm.  Hemostasis was obtained with a cautery.  Oropharynx was irrigated with saline at the completion of procedure.  Clarence Stark was awoken from anesthesia and transferred  to the recovery room,  postop doing well.   DISPOSITION:  Clarence Stark is discharged home later this morning on amoxicillin  suspension 400 mg b.i.d. for 10 days, Tylenol, Lortab elixir 1 to 1-1/2  tablespoons q.4 h. p.r.n. pain.  He will follow up in my office in 1  week for recheck to review final pathology report.  We will plan on  raising the patient with followup appointment with radiation oncology.           ______________________________  Clarence Stark, M.D.     CEN/MEDQ  D:  03/02/2008  T:  03/03/2008  Job:  [id number]   cc:   Clarence Mole. Cato Mulligan, MD  Clarence Stark, M.D.

## 2011-01-22 NOTE — Consult Note (Signed)
NEW PATIENT CONSULTATION   Clarence Stark, Clarence Stark  DOB:  01-Sep-1961                                        October 17, 2008  CHART #:  [id number]   REASON FOR CONSULTATION:  Fusiform ascending aortic aneurysm 4.5 cm.   PAST MEDICAL HISTORY:  1. Stage IV squamous cell carcinoma of the base of tongue treated with      radiation and carboplatinum fall of 2009 in remission.  2. Right thyroid nodule, benign.  3. Status post PEG tube for nutrition, now removed.  4. History of multiple skin cancers.   CURRENT MEDICATIONS:  Temazepam 1 tablet p.o. nightly.   ALLERGIES:  None.   PRESENT ILLNESS:  I was asked to evaluate this 50 year old Caucasian  nonsmoker for evaluation and therapy of recently diagnosed 4.5-cm  aneurysm of the ascending thoracic aorta.  This was noted on an  incidental finding when he presented with a squamous cell carcinoma at  the base of the tongue with metastatic disease to the right jugular  lymph nodes.  The CT scan showed the fusiform aneurysm in July 2009.  The patient has undergone successful therapy and is fortunately now in  remission.  A followup PET scan in December showed no suspicious  hypermetabolic activity and no change in the size of the ascending  aorta.  The scans show no evidence of penetrating ulcer, dissection,  false lumen, or hematoma.   The patient's risk factors are negative for hypertension, smoking,  hyperlipidemia, or positive family history.  He has no clinical stigmata  of Marfan disease and he has had a very active prior history with sports  activities etc.  He denies any chest pain, shortness of breath, or prior  knowledge of a cardiac murmur.   SOCIAL HISTORY:  The patient is employed at [employer].  He is  married.  Does not drink or smoke.   FAMILY HISTORY:  Negative for aneurysm disease of the thoracic or  abdominal aorta.   REVIEW OF SYSTEMS:  He still has some mucosal inflammation of his mouth  and the  throat following radiation therapy.  He is starting to gain his  weight back that he lost during the chemoradiation.  The PEG tube has  been removed and he is in a regular diet.  He is able to tolerate a  fairly active lifestyle including pilates and yoga and light  weightlifting.  He has altered taste, but no shortness of breath, chest  pain, dizziness, change in vision and he denies any active dental  problems and was evaluated by Dr. Kristin Bruins before his radiation therapy.  He denies any history of TIA, claudication, DVT, arrhythmia, syncope, or  stroke.   PHYSICAL EXAMINATION:  VITAL SIGNS:  The patient is 6 feet 3, weighs  205, blood pressure 120/70, pulse 69, respirations 18, and saturation  98% on room air.  GENERAL APPEARANCE:  Well middle-aged male, alert, oriented, and  appropriate.  HEENT:  Normocephalic.  Dentition good.  Uvula normal and no  abnormalities in the tongue that I could note.  NECK:  Without JVD or mass.  There is a surgical scar along the right  sternocleidomastoid.  No palpable adenopathy.  LUNGS:  Breath sounds are clear.  CARDIAC:  Regular.  I hear no cardiac murmur or gallop.  ABDOMEN:  Soft without pulsatile mass.  EXTREMITIES:  No clubbing, cyanosis, or edema.  Peripheral pulses are 2+  in all extremities.  NEUROLOGIC:  Intact.   LABORATORY DATA:  I reviewed the CT angiogram from July and the CT/PET  scan in December 2009 which showed a stable 4.5 cm fusiform aneurysm of  the ascending aorta without dissection, hematoma, or penetrating ulcer.   IMPRESSION AND PLAN:  The patient has mild dilatation of the aorta  especially considering his increased body surface area from a height of  6 feet 3 inches.  With normal blood pressure and history of normal  cholesterol profile, I would not recommend beta-blocker or a statin at  this time to help maintain integrity of his aortic tissue.  I will  proceed with a 2-D echo to investigate the possibility of  bicuspid  aortic valve which could be related to a root aneurysm.  At this point,  I would just recommend surveillance of his thoracic aorta with serial  scans and we will attempt to do this with MRIs to reduce the amount of  radiation as he has had considerable radiation in the past.  I have also  discussed limiting his activities to avoid heavy weight training, but  otherwise his normal aerobic activity that he has currently involved  with his spine.   The patient will return for followup MRA/MRI of the thoracic aorta 1  year following his previous scan.  If there is any abnormality on the 2-  D echo, I will let the patient know.    Kerin Perna, M.D.  Electronically Signed   PV/MEDQ  D:  10/17/2008  T:  10/18/2008  Job:  [id number]   cc:   Valetta Mole. Swords, MD  Nicki Guadalajara, M.D.  Maryln Gottron, M.D.

## 2011-01-22 NOTE — Discharge Summary (Signed)
Clarence Stark, Clarence Stark              ACCOUNT NO.:  [id number]   MEDICAL RECORD NO.:  [id number]          PATIENT TYPE:  INP   LOCATION:  1321                         FACILITY:  [facility]   PHYSICIAN:  Leighton Roach. Truett Perna, M.D. DATE OF BIRTH:  [birth year]   DATE OF ADMISSION:  04/29/2008  DATE OF DISCHARGE:  05/01/2008                               DISCHARGE SUMMARY   ADMITTING DIAGNOSIS:  Nausea, vomiting.   DISCHARGE DIAGNOSIS:  Nausea, vomiting related to recent chemotherapy  for squamous cell carcinoma of the head and neck.   HOSPITAL COURSE:  Clarence Stark was admitted to the inpatient service  secondary to intractable nausea, vomiting and inability to tolerate his  G-tube feedings.  He received IV fluids and antiemetics as needed.  His  symptoms improved.  On the morning of May 01, 2008, he was able to  tolerate 1 can of G-tube feeding, he was experiencing no further emesis  and minimal nausea that was relieved with antiemetic therapy.  Mr.  Stark was, therefore, discharged home to continue outpatient followup  as scheduled.  He will continue his prehospitalization medication  regimen, attention as per his scheduled appointment, which includes  Zofran 4 mg sublingual as needed, Ativan 1 mg sublingual as needed,  Compazine 10 mg orally as needed.  As an outpatient there are plans for  him to receive his tube feedings as a continuous infusion per pump  rather than bolus feedings.  He knows to seek medical attention prior to  his next appointment should he have any questions, problems, concerns,  any worsening nausea, vomiting or abdominal distention.     ______________________________  Clarence Mcgregor, NP      Leighton Roach. Truett Perna, M.D.  Electronically Signed    ML/MEDQ  D:  05/01/2008  T:  05/01/2008  Job:  [id number]

## 2011-02-06 ENCOUNTER — Ambulatory Visit
Admission: RE | Admit: 2011-02-06 | Discharge: 2011-02-06 | Disposition: A | Discharge status: Home / Self Care | Payer: BC Managed Care – PPO | Source: Ambulatory Visit | Attending: Radiation Oncology | Admitting: Radiation Oncology

## 2011-06-06 LAB — POCT HEMOGLOBIN-HEMACUE: Hemoglobin: 14

## 2011-06-07 LAB — CBC
HCT: 40.4
Hemoglobin: 13.8
MCV: 98.2
RDW: 12.9

## 2011-07-16 ENCOUNTER — Ambulatory Visit: Payer: BC Managed Care – PPO | Admitting: Radiation Oncology

## 2011-07-25 ENCOUNTER — Encounter: Payer: Self-pay | Admitting: Radiation Oncology

## 2011-07-30 ENCOUNTER — Ambulatory Visit: Payer: BC Managed Care – PPO | Admitting: Radiation Oncology

## 2011-07-30 ENCOUNTER — Encounter: Payer: Self-pay | Admitting: Radiation Oncology

## 2011-07-30 ENCOUNTER — Ambulatory Visit
Admission: RE | Admit: 2011-07-30 | Discharge: 2011-07-30 | Disposition: A | Discharge status: Home / Self Care | Payer: BC Managed Care – PPO | Source: Ambulatory Visit | Attending: Radiation Oncology | Admitting: Radiation Oncology

## 2011-07-30 DIAGNOSIS — Z09 Encounter for follow-up examination after completed treatment for conditions other than malignant neoplasm: Secondary | ICD-10-CM

## 2011-07-30 HISTORY — DX: Malignant (primary) neoplasm, unspecified: C80.1

## 2011-07-30 NOTE — Progress Notes (Signed)
No problems swallowing, does c/o dry mouth.

## 2011-07-31 NOTE — Progress Notes (Signed)
CC:   Clarence Stark. Clarence Stark, M.D. Clarence Stark, M.D. Clarence Stark, M.D. Clarence Stark, DDS Clarence Stark, D.D.S.  NARRATIVE:  Clarence Stark returns today approximately 3 years and 2 months following completion of chemoradiation in the management of his stage IV squamous cell carcinoma of the base of tongue, metastatic to his right neck.  He is generally doing well and is without complaints today.  He did not see Dr. Ezzard Stark this past summer as requested.  He has seen Clarence Stark for his annual followup, and he believes that he had a TSH determination.  He keeps up with Clarence Stark, his dentist.  PHYSICAL EXAMINATION:  Nodes:  There is no palpable lymphadenopathy in the neck.  HEENT:  Oral cavity and oropharynx are unremarkable to inspection.  The mouth is moist.  On indirect mirror examination there is no evidence for recurrent disease along his base of tongue. Palpation of the oropharynx is confirmatory.  IMPRESSION:  No evidence for recurrent disease.  I told Clarence Stark that if he sees Dr. Ezzard Stark in 6 months he can return to see me for a followup visit in 1 year.  He will check on his TSH determination just to make sure that he is not becoming hypothyroid.    ______________________________ Clarence Stark, M.D. RJM/MEDQ  D:  07/30/2011  T:  07/30/2011  Job:  [id number]

## 2011-08-07 ENCOUNTER — Other Ambulatory Visit: Payer: Self-pay | Admitting: Dermatology

## 2012-01-01 ENCOUNTER — Other Ambulatory Visit: Payer: Self-pay | Admitting: Dermatology

## 2012-06-04 ENCOUNTER — Other Ambulatory Visit: Payer: Self-pay | Admitting: Dermatology

## 2012-07-29 ENCOUNTER — Ambulatory Visit: Payer: BC Managed Care – PPO | Admitting: Radiation Oncology

## 2012-07-31 ENCOUNTER — Encounter: Payer: Self-pay | Admitting: Radiation Oncology

## 2012-08-04 ENCOUNTER — Ambulatory Visit
Admission: RE | Admit: 2012-08-04 | Discharge: 2012-08-04 | Disposition: A | Discharge status: Home / Self Care | Payer: BC Managed Care – PPO | Source: Ambulatory Visit | Attending: Radiation Oncology | Admitting: Radiation Oncology

## 2012-08-04 ENCOUNTER — Encounter: Payer: Self-pay | Admitting: Radiation Oncology

## 2012-08-04 VITALS — BP 130/71 | HR 61 | Temp 97.6°F | Resp 20 | Wt 222.8 lb

## 2012-08-04 DIAGNOSIS — Z09 Encounter for follow-up examination after completed treatment for conditions other than malignant neoplasm: Secondary | ICD-10-CM | POA: Insufficient documentation

## 2012-08-04 DIAGNOSIS — C01 Malignant neoplasm of base of tongue: Secondary | ICD-10-CM

## 2012-08-04 DIAGNOSIS — Z8589 Personal history of malignant neoplasm of other organs and systems: Secondary | ICD-10-CM | POA: Insufficient documentation

## 2012-08-04 DIAGNOSIS — Z85810 Personal history of malignant neoplasm of tongue: Secondary | ICD-10-CM | POA: Insufficient documentation

## 2012-08-04 NOTE — Progress Notes (Signed)
Patient here follow up rad txs: right neck base tongue 04/04/08-05/24/08 Alert,oriented x3, no c.o pain, or swallowing difficulty's, does still have taste sensitivity to saly foods, and hot foods, such as salsa, cocktail sause Last TSH show done 07/13/2009=2.302 10:48 AM

## 2012-08-04 NOTE — Progress Notes (Addendum)
Followup note:  The patient returns today approximately 4 years and 2 months following completion of chemoradiation in the management of his stage IV squamous cell carcinoma the base of tongue metastatic to the right neck. He is generally doing well although he does have some difficulty with dry foods including popcorn bread. His wife notes that he is occasionally hoarse. He is now working in [place]. He saw Dr. Valentina Lucks this past summer but states that he did not have a TSH performed according to the patient. He saw Dr. Ezzard Standing this past August and he will see him again in approximately 6 months to one year. He inquires about followup of his thyroid nodule, and I do not recall if he had biopsy for malignancy. I do not see a thyroid biopsy report in EP[facility]. I told that he should be followed by either Dr. Ezzard Standing, Dr. Valentina Lucks, or both of them. He maintains his dental followup.  Physical examination: He looks well. Wt Readings from Last 3 Encounters:  08/04/12 222 lb 12.8 oz (101.061 kg)  07/30/11 212 lb 12.8 oz (96.525 kg)  01/14/08 212 lb (96.163 kg)   Temp Readings from Last 3 Encounters:  08/04/12 97.6 F (36.4 C) Oral  07/30/11 97.7 F (36.5 C) Oral   BP Readings from Last 3 Encounters:  08/04/12 130/71  07/30/11 115/75  02/16/08 124/72   Pulse Readings from Last 3 Encounters:  08/04/12 61  07/30/11 62  02/16/08 76   Head and neck examination: There is no palpable lymphadenopathy in the neck. Oral cavity and oropharynx are unremarkable to inspection. The mouth is moist. On indirect mirror examination there is no evidence for recurrent disease. Both true vocal cords move well and oppose each other at the midline.   Impression: No evidence for recurrent head and neck cancer. I told the patient that he should see Dr. Ezzard Standing in 6 months and return to see me in one year. After that visit I will sign off on him since he will be over 5 years out from his treatment. I will order a TSH today since  he has not had one since 2010. Going forward this can be obtained at the time of his annual physical with Dr. Valentina Lucks. I ask that Dr. Ezzard Standing and Dr. Valentina Lucks following his thyroid disease/nodule.   CC: Dr. Narda Bonds, Dr. Kirby Funk

## 2012-11-16 ENCOUNTER — Other Ambulatory Visit: Payer: Self-pay | Admitting: Dermatology

## 2012-12-24 ENCOUNTER — Other Ambulatory Visit: Payer: Self-pay | Admitting: Dermatology

## 2013-07-05 ENCOUNTER — Other Ambulatory Visit: Payer: Self-pay | Admitting: Dermatology

## 2013-07-29 ENCOUNTER — Encounter: Payer: Self-pay | Admitting: *Deleted

## 2013-08-03 ENCOUNTER — Ambulatory Visit
Admission: RE | Admit: 2013-08-03 | Discharge: 2013-08-03 | Disposition: A | Discharge status: Home / Self Care | Payer: BC Managed Care – PPO | Source: Ambulatory Visit | Attending: Radiation Oncology | Admitting: Radiation Oncology

## 2013-08-03 ENCOUNTER — Encounter: Payer: Self-pay | Admitting: Radiation Oncology

## 2013-08-03 VITALS — Resp 16 | Wt 215.2 lb

## 2013-08-03 DIAGNOSIS — C01 Malignant neoplasm of base of tongue: Secondary | ICD-10-CM

## 2013-08-03 NOTE — Progress Notes (Signed)
CC: Dr. Narda Bonds Dr. Kirby Funk  Followup note: Clarence Stark returns today approximately 5 years and 2 months following completion of chemoradiation in the management of his stage IV (T1 versus T2 N2b M0) squamous cell carcinoma of the base of tongue metastatic to the right neck. He is without new complaints today. He still has occasional hoarseness. He saw Dr. Valentina Lucks this past late summer and apparently had a TSH which was satisfactory according to the patient. He still travels back and forth from [place] to [place] for work. He missed his appointment 6 months ago with Dr. Ezzard Standing. He has not had followup of his thyroid nodule.  Physical examination: Alert and oriented. Filed Vitals:   08/03/13 1035  Resp: 16   Head and neck examination: Nodes: There is no palpable lymphadenopathy in the neck. Oral cavity and oropharynx unremarkable to inspection. The mouth is moist. Indirect mirror examination confirmatory. Palpation oropharynx is unremarkable. Cranial nerves II through XII intact.  Impression: No evidence for recurrent disease. Late recurrences are unusual and he has a strong likelihood of having been care. Other that he should be seen at least every 9 months, and I can alternate with Dr. Ezzard Standing. I told I were like for him to see Dr. Ezzard Standing within the next one to 2 months to see if he needs any followup of his thyroid nodule.  Plan: Followup in the future Dr. Ezzard Standing, then followup with Dr. Ezzard Standing and 9 months, and I can see him for a followup visit in 18 months.

## 2013-08-03 NOTE — Progress Notes (Signed)
Continues to work out of [place] a few weeks out of each month. Steady gait noted. Denies pain at this time. Patient has not seen Dr. Ezzard Standing in a year and a half. Reports he had a full physical in August or September by Dr. Valentina Lucks and a TSH was drawn then. Reports he saw his dentist last month and was without caries. Reports dry mouth is manageable. Denies pain or difficulty associated with swallowing. Reports that when he speak a lot he becomes hoarse. Reports dry foods including popcorn and bread continue to be difficult to swallow.

## 2014-06-09 ENCOUNTER — Ambulatory Visit (INDEPENDENT_AMBULATORY_CARE_PROVIDER_SITE_OTHER): Payer: BC Managed Care – PPO

## 2014-06-09 ENCOUNTER — Encounter: Payer: Self-pay | Admitting: Podiatry

## 2014-06-09 ENCOUNTER — Ambulatory Visit (INDEPENDENT_AMBULATORY_CARE_PROVIDER_SITE_OTHER): Payer: BC Managed Care – PPO | Admitting: Podiatry

## 2014-06-09 VITALS — BP 112/66 | HR 58 | Resp 16

## 2014-06-09 DIAGNOSIS — M79674 Pain in right toe(s): Secondary | ICD-10-CM

## 2014-06-09 DIAGNOSIS — M2041 Other hammer toe(s) (acquired), right foot: Secondary | ICD-10-CM

## 2014-06-09 NOTE — Progress Notes (Signed)
   Subjective:    Patient ID: Clarence Stark, male    DOB: 1961-05-25, 53 y.o.   MRN: [id number]  HPI Comments: "My toe is getting shorter or something"  Patient c/o tenderness 2nd toe right for 6 months. Its starting to get hammered. Some redness at the knuckle. There is an open sore medial side. No home treatment.  Toe Pain       Review of Systems  All other systems reviewed and are negative.      Objective:   Physical Exam        Assessment & Plan:

## 2014-06-09 NOTE — Progress Notes (Signed)
Subjective:     Patient ID: Clarence Stark, male   DOB: [birth year], 53 y.o.   MRN: [id number]  Toe Pain    patient presents stating I have a rigid elevation of my second toe right foot and while it doesn't bother me much I am noticing it more in shoe gear. States it's been this way for about a year   Review of Systems  All other systems reviewed and are negative.      Objective:   Physical Exam  Nursing note and vitals reviewed. Constitutional: He is oriented to person, place, and time.  Cardiovascular: Intact distal pulses.   Musculoskeletal: Normal range of motion.  Neurological: He is oriented to person, place, and time.  Skin: Skin is warm.   neurovascular status found to be intact with muscle strength adequate and range of motion subtalar and midtarsal joint within normal limits. Patient is noted to have rigid contracture digit 2 right with slight redness on top of the toe and minimal discomfort in the metatarsal phalangeal joint. Patient is well oriented x3 and has good digital perfusion with normal arch height upon weightbearing     Assessment:     Probable flexor plate stretch allowing elevation of the second toe in rigid contracture of the digit secondary to flexor plate stretch    Plan:     H&P performed and x-rays reviewed. Discussed treatment and at one point digital fusion may be necessary but at this point I would hold off and using padding and soft-type shoes and if symptoms were to progress it may need to be straightened at one point. Educated patient on deformity

## 2014-07-29 ENCOUNTER — Other Ambulatory Visit: Payer: Self-pay | Admitting: Oncology

## 2014-08-02 ENCOUNTER — Other Ambulatory Visit: Payer: Self-pay | Admitting: Oncology

## 2014-09-15 ENCOUNTER — Other Ambulatory Visit: Payer: Self-pay | Admitting: Oncology

## 2015-01-17 ENCOUNTER — Ambulatory Visit: Payer: BLUE CROSS/BLUE SHIELD | Attending: Orthopedic Surgery | Admitting: Physical Therapy

## 2015-01-17 DIAGNOSIS — M25631 Stiffness of right wrist, not elsewhere classified: Secondary | ICD-10-CM | POA: Diagnosis not present

## 2015-01-17 DIAGNOSIS — M25521 Pain in right elbow: Secondary | ICD-10-CM

## 2015-01-17 DIAGNOSIS — M25531 Pain in right wrist: Secondary | ICD-10-CM | POA: Insufficient documentation

## 2015-01-17 DIAGNOSIS — R29898 Other symptoms and signs involving the musculoskeletal system: Secondary | ICD-10-CM | POA: Diagnosis not present
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
## 2015-01-17 NOTE — Patient Instructions (Signed)
   Aashvi Rezabek PT, DPT, LAT, ATC   Outpatient Rehabilitation Phone: [phone]     

## 2015-02-01 ENCOUNTER — Ambulatory Visit
Admission: RE | Admit: 2015-02-01 | Discharge: 2015-02-01 | Disposition: A | Discharge status: Home / Self Care | Payer: BLUE CROSS/BLUE SHIELD | Source: Ambulatory Visit | Attending: Radiation Oncology | Admitting: Radiation Oncology

## 2015-02-01 ENCOUNTER — Inpatient Hospital Stay
Admission: RE | Admit: 2015-02-01 | Payer: BC Managed Care – PPO | Source: Ambulatory Visit | Admitting: Radiation Oncology

## 2015-02-01 ENCOUNTER — Ambulatory Visit: Payer: BLUE CROSS/BLUE SHIELD | Admitting: Radiation Oncology

## 2015-02-13 ENCOUNTER — Ambulatory Visit: Payer: BLUE CROSS/BLUE SHIELD | Attending: Orthopedic Surgery | Admitting: Physical Therapy

## 2015-03-03 ENCOUNTER — Telehealth: Payer: Self-pay | Admitting: Physical Therapy

## 2015-03-03 NOTE — Telephone Encounter (Signed)
Tried calling pt regarding missing appointment on 02/13/2015 and no future appointments. When calling number appears to be changed or no longer in service.

## 2015-03-20 ENCOUNTER — Ambulatory Visit: Payer: BLUE CROSS/BLUE SHIELD | Attending: Orthopedic Surgery | Admitting: Physical Therapy

## 2015-03-20 DIAGNOSIS — R29898 Other symptoms and signs involving the musculoskeletal system: Secondary | ICD-10-CM

## 2015-03-20 DIAGNOSIS — M25521 Pain in right elbow: Secondary | ICD-10-CM | POA: Diagnosis present

## 2015-03-20 DIAGNOSIS — M25531 Pain in right wrist: Secondary | ICD-10-CM | POA: Diagnosis present

## 2015-03-20 DIAGNOSIS — M25631 Stiffness of right wrist, not elsewhere classified: Secondary | ICD-10-CM | POA: Insufficient documentation

## 2015-03-20 NOTE — Patient Instructions (Addendum)
   Kristoffer Leamon PT, DPT, LAT, ATC  [facility] Phone: [phone]     
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
## 2015-08-23 ENCOUNTER — Emergency Department (HOSPITAL_COMMUNITY)
Admission: EM | Admit: 2015-08-23 | Discharge: 2015-08-23 | Disposition: A | Discharge status: Home / Self Care | Payer: BLUE CROSS/BLUE SHIELD | Source: Home / Self Care

## 2015-08-23 ENCOUNTER — Encounter (HOSPITAL_COMMUNITY): Payer: Self-pay | Admitting: Emergency Medicine

## 2015-08-23 DIAGNOSIS — K4190 Unilateral femoral hernia, without obstruction or gangrene, not specified as recurrent: Secondary | ICD-10-CM

## 2015-08-23 NOTE — ED Notes (Signed)
Here for referral for hernia surgery

## 2015-08-23 NOTE — Discharge Instructions (Signed)
Hernia  A hernia happens when an organ or tissue inside your body pushes out through a weak spot in the belly (abdomen).  HOME CARE  · Avoid stretching or overusing (straining) the muscles near the hernia.  · Do not lift anything heavier than 10 lb (4.5 kg).  · Use the muscles in your leg when you lift something up. Do not use the muscles in your back.  · When you cough, try to cough gently.  · Eat a diet that has a lot of fiber. Eat lots of fruits and vegetables.  · Drink enough fluids to keep your pee (urine) clear or pale yellow. Try to drink 6-8 glasses of water a day.  · Take medicines to make your poop soft (stool softeners) as told by your doctor.  · Lose weight, if you are overweight.  · Do not use any tobacco products, including cigarettes, chewing tobacco, or electronic cigarettes. If you need help quitting, ask your doctor.  · Keep all follow-up visits as told by your doctor. This is important.  GET HELP IF:  · The skin by the hernia gets puffy (swollen) or red.  · The hernia is painful.  GET HELP RIGHT AWAY IF:  · You have a fever.  · You have belly pain that is getting worse.  · You feel sick to your stomach (nauseous) or you throw up (vomit).  · You cannot push the hernia back in place by gently pressing on it while you are lying down.  · The hernia:    Changes in shape or size.    Is stuck outside your belly.    Changes color.    Feels hard or tender.     This information is not intended to replace advice given to you by your health care provider. Make sure you discuss any questions you have with your health care provider.     Document Released: 02/13/2010 Document Revised: 09/16/2014 Document Reviewed: 07/06/2014  Elsevier Interactive Patient Education ©2016 [employer]

## 2015-08-23 NOTE — ED Provider Notes (Signed)
CSN: [id number]     Arrival date & time 08/23/15  1319 History   None    Chief Complaint  Patient presents with  . Hernia    wants referral for hernia surgery   (Consider location/radiation/quality/duration/timing/severity/associated sxs/prior Treatment) HPI History obtained from patient:   LOCATION: Right groin SEVERITY: 4 DURATION: Quite some time CONTEXT: Onset of symptoms several months ago steadily getting worse. QUALITY: Very similar to hernia pain on the left side. Prior to repair MODIFYING FACTORS: Ibuprofen for pain ASSOCIATED SYMPTOMS: Bulging in the groin area when exerting himself TIMING: Constant/episodic     Past Medical History  Diagnosis Date  . Cancer ([place])   . Head and neck cancer   . History of radiation therapy 04/04/08-05/24/08    tongue,   Past Surgical History  Procedure Laterality Date  . Hx knee surgery     Family History  Problem Relation Age of Onset  . Cancer Mother   . Cancer Father   . Cancer Sister    Social History  Substance Use Topics  . Smoking status: Never Smoker   . Smokeless tobacco: Never Used  . Alcohol Use: Yes     Comment: MODERATE DRINKER    Review of Systems ROS +'ve right femoral groin hernia  Denies: HEADACHE, NAUSEA, ABDOMINAL PAIN, CHEST PAIN, CONGESTION, DYSURIA, SHORTNESS OF BREATH  Allergies  Review of patient's allergies indicates no known allergies.  Home Medications   Prior to Admission medications   Medication Sig Start Date End Date Taking? Authorizing Provider  ibuprofen (ADVIL,MOTRIN) 200 MG tablet Take 800 mg by mouth every 6 (six) hours as needed.    Historical Provider, MD  naproxen sodium (ANAPROX) 220 MG tablet Take 220 mg by mouth 2 (two) times daily with a meal.    Historical Provider, MD  temazepam (RESTORIL) 15 MG capsule Take 15 mg by mouth at bedtime as needed.      Historical Provider, MD   Meds Ordered and Administered this Visit  Medications - No data to display  BP 150/88 mmHg   Pulse 76  Temp(Src) 98.7 F (37.1 C) (Oral)  Resp 16  SpO2 98% No data found.   Physical Exam  Constitutional: He appears well-developed and well-nourished.  Abdominal: Soft. A hernia is present. Hernia confirmed positive in the right inguinal area.      ED Course  Procedures (including critical care time)  Labs Review Labs Reviewed - No data to display  Imaging Review No results found.   Visual Acuity Review  Right Eye Distance:   Left Eye Distance:   Bilateral Distance:    Right Eye Near:   Left Eye Near:    Bilateral Near:         MDM   1. Unilateral femoral hernia without obstruction or gangrene, recurrence not specified    Patient is primarily here to request a oral to Dr. Jackolyn Confer, patient states that he spoke with his insurance company this morning and it was told that he did not need a referral from them but the surgeon's office requires a referral. Referral to Dr. Zella Richer made on Epic instructions have been provided to the patient patient states that Dr. Zella Richer is a friend of his and he will call his office to arrange follow-up instructions of care provided discharged home in stable condition.  THIS NOTE WAS GENERATED USING A VOICE RECOGNITION SOFTWARE PROGRAM. ALL REASONABLE EFFORTS  WERE MADE TO PROOFREAD THIS DOCUMENT FOR ACCURACY.     Pilar Grammes  [place], [place] 08/23/15 1759

## 2015-11-26 ENCOUNTER — Encounter (HOSPITAL_COMMUNITY): Payer: Self-pay | Admitting: *Deleted

## 2015-11-26 ENCOUNTER — Emergency Department (HOSPITAL_COMMUNITY)
Admission: EM | Admit: 2015-11-26 | Discharge: 2015-11-26 | Disposition: A | Discharge status: Home / Self Care | Payer: BLUE CROSS/BLUE SHIELD | Source: Home / Self Care | Attending: Emergency Medicine | Admitting: Emergency Medicine

## 2015-11-26 DIAGNOSIS — IMO0002 Reserved for concepts with insufficient information to code with codable children: Secondary | ICD-10-CM

## 2015-11-26 DIAGNOSIS — T148 Other injury of unspecified body region: Secondary | ICD-10-CM | POA: Diagnosis not present

## 2015-11-26 MED ORDER — CEPHALEXIN 500 MG PO CAPS: 500.0000 mg | ORAL_CAPSULE | Freq: Three times a day (TID) | ORAL | Status: DC

## 2015-11-26 MED ORDER — POVIDONE-IODINE 10 % EX SOLN
CUTANEOUS | Status: AC
  Filled 2015-11-26: qty 118

## 2015-11-26 MED ORDER — LIDOCAINE HCL 2 % IJ SOLN
INTRAMUSCULAR | Status: AC
  Filled 2015-11-26: qty 20

## 2015-11-26 NOTE — ED Notes (Signed)
Pt tolerating suture placement well.

## 2015-11-26 NOTE — ED Notes (Signed)
Laceration to left index and middle finger from knife @ approx 1700.  CMS intact, bleeding controlled.  Last Tdap < 5 yrs ago.  Fingers placed in Betadine/NS soak.

## 2015-11-26 NOTE — ED Notes (Signed)
Dressing applied per PA student.

## 2015-11-26 NOTE — ED Provider Notes (Signed)
CSN: [id number]     Arrival date & time 11/26/15  1758 History   First MD Initiated Contact with Patient 11/26/15 1918     Chief Complaint  Patient presents with  . Extremity Laceration   (Consider location/radiation/quality/duration/timing/severity/associated sxs/prior Treatment) HPI Clarence Stark is a 55 y.o. right-hand dominant male presenting with laceration to left 2nd and 3rd fingers. Around 5 pm today, he was installing landscaping lighting in his front yard and was stripping electric wires with a knife. The knife slipped and he cut himself.  He denies numbness/tingling, weakness, and limited ROM. Pain is 4/10. He received his last tetanus shot within the past 5 years.   Past Medical History  Diagnosis Date  . Cancer ([place])   . Head and neck cancer   . History of radiation therapy 04/04/08-05/24/08    tongue,   Past Surgical History  Procedure Laterality Date  . Hx knee surgery    . Hernia repair     Family History  Problem Relation Age of Onset  . Cancer Mother   . Cancer Father   . Cancer Sister    Social History  Substance Use Topics  . Smoking status: Never Smoker   . Smokeless tobacco: Never Used  . Alcohol Use: Yes     Comment: Daily alcohol    Review of Systems See HPI.  Allergies  Review of patient's allergies indicates no known allergies.  Home Medications   Prior to Admission medications   Medication Sig Start Date End Date Taking? Authorizing Provider  cephALEXin (KEFLEX) 500 MG capsule Take 1 capsule (500 mg total) by mouth 3 (three) times daily. 11/26/15   Konrad Felix, PA  cephALEXin (KEFLEX) 500 MG capsule Take 1 capsule (500 mg total) by mouth 3 (three) times daily. 11/26/15   Konrad Felix, PA  ibuprofen (ADVIL,MOTRIN) 200 MG tablet Take 800 mg by mouth every 6 (six) hours as needed.    Historical Provider, MD  naproxen sodium (ANAPROX) 220 MG tablet Take 220 mg by mouth 2 (two) times daily with a meal.    Historical Provider, MD   temazepam (RESTORIL) 15 MG capsule Take 15 mg by mouth at bedtime as needed.      Historical Provider, MD   Meds Ordered and Administered this Visit  Medications - No data to display  BP 139/83 mmHg  Pulse 55  Temp(Src) 98 F (36.7 C) (Oral)  Resp 18  SpO2 100% No data found.   Physical Exam General: Well-appearing male, no acute distress, pleasant; fingers wrapped in large band-aids. MSK: Left 2nd finger: Radial side 2.5 cm laceration extending to palmar distal phalanx.  No damage to nail. Left 3rd finger: Radial side 4 cm longitudinal laceration with smooth edges. No damage to nail. Radial pulses intact bilaterally. Sensation to light touch intact. Flexor tendons intact. No foreign bodies.  ED Course  .Marland KitchenLaceration Repair Date/Time: 11/26/2015 9:10 PM Performed by: Konrad Felix Authorized by: Melony Overly Consent: Verbal consent obtained. Risks and benefits: risks, benefits and alternatives were discussed Consent given by: patient Patient understanding: patient states understanding of the procedure being performed Patient identity confirmed: verbally with patient Body area: upper extremity (Left index finger, left long finger) Wound length (cm): index finger 2.5 cm; long finger 4 cm. Foreign bodies: no foreign bodies Tendon involvement: none Nerve involvement: superficial Vascular damage: no Anesthesia: digital block Local anesthetic: lidocaine 2% without epinephrine Anesthetic total: 10 ml Patient sedated: no Preparation: Patient was prepped and draped in  the usual sterile fashion. Irrigation solution: saline with betadine. Amount of cleaning: standard Debridement: minimal Degree of undermining: none Skin closure: 5-0 Prolene Number of sutures: left index finger 6 sutures; left long finger 8 sutures. Technique: simple Approximation: close Approximation difficulty: simple Dressing: Xeroform, sterile gauze, kerlix, coban. Patient tolerance: Patient tolerated  the procedure well with no immediate complications   (including critical care time)  Labs Review Labs Reviewed - No data to display  Imaging Review No results found.   Visual Acuity Review  Right Eye Distance:   Left Eye Distance:   Bilateral Distance:    Right Eye Near:   Left Eye Near:    Bilateral Near:         MDM   1. Laceration    Patient advised to keep dressings dry for 24 hours.  Discharged with Keflex. Will return in about 7 days for suture removal.    Konrad Felix, PA 11/27/15 [phone]

## 2015-11-26 NOTE — Discharge Instructions (Signed)
Laceration Care, Adult  A laceration is a cut that goes through all layers of the skin. The cut also goes into the tissue that is right under the skin. Some cuts heal on their own. Others need to be closed with stitches (sutures), staples, skin adhesive strips, or wound glue. Taking care of your cut lowers your risk of infection and helps your cut to heal better.  HOW TO TAKE CARE OF YOUR CUT  For stitches or staples:  · Keep the wound clean and dry.  · If you were given a bandage (dressing), you should change it at least one time per day or as told by your doctor. You should also change it if it gets wet or dirty.  · Keep the wound completely dry for the first 24 hours or as told by your doctor. After that time, you may take a shower or a bath. However, make sure that the wound is not soaked in water until after the stitches or staples have been removed.  · Clean the wound one time each day or as told by your doctor:    Wash the wound with soap and water.    Rinse the wound with water until all of the soap comes off.    Pat the wound dry with a clean towel. Do not rub the wound.  · After you clean the wound, put a thin layer of antibiotic ointment on it as told by your doctor. This ointment:    Helps to prevent infection.    Keeps the bandage from sticking to the wound.  · Have your stitches or staples removed as told by your doctor.  If your doctor used skin adhesive strips:   · Keep the wound clean and dry.  · If you were given a bandage, you should change it at least one time per day or as told by your doctor. You should also change it if it gets dirty or wet.  · Do not get the skin adhesive strips wet. You can take a shower or a bath, but be careful to keep the wound dry.  · If the wound gets wet, pat it dry with a clean towel. Do not rub the wound.  · Skin adhesive strips fall off on their own. You can trim the strips as the wound heals. Do not remove any strips that are still stuck to the wound. They will  fall off after a while.  If your doctor used wound glue:  · Try to keep your wound dry, but you may briefly wet it in the shower or bath. Do not soak the wound in water, such as by swimming.  · After you take a shower or a bath, gently pat the wound dry with a clean towel. Do not rub the wound.  · Do not do any activities that will make you really sweaty until the skin glue has fallen off on its own.  · Do not apply liquid, cream, or ointment medicine to your wound while the skin glue is still on.  · If you were given a bandage, you should change it at least one time per day or as told by your doctor. You should also change it if it gets dirty or wet.  · If a bandage is placed over the wound, do not let the tape for the bandage touch the skin glue.  · Do not pick at the glue. The skin glue usually stays on for 5-10 days. Then, it   falls off of the skin.  General Instructions   · To help prevent scarring, make sure to cover your wound with sunscreen whenever you are outside after stitches are removed, after adhesive strips are removed, or when wound glue stays in place and the wound is healed. Make sure to wear a sunscreen of at least 30 SPF.  · Take over-the-counter and prescription medicines only as told by your doctor.  · If you were given antibiotic medicine or ointment, take or apply it as told by your doctor. Do not stop using the antibiotic even if your wound is getting better.  · Do not scratch or pick at the wound.  · Keep all follow-up visits as told by your doctor. This is important.  · Check your wound every day for signs of infection. Watch for:    Redness, swelling, or pain.    Fluid, blood, or pus.  · Raise (elevate) the injured area above the level of your heart while you are sitting or lying down, if possible.  GET HELP IF:  · You got a tetanus shot and you have any of these problems at the injection site:    Swelling.    Very bad pain.    Redness.    Bleeding.  · You have a fever.  · A wound that was  closed breaks open.  · You notice a bad smell coming from your wound or your bandage.  · You notice something coming out of the wound, such as wood or glass.  · Medicine does not help your pain.  · You have more redness, swelling, or pain at the site of your wound.  · You have fluid, blood, or pus coming from your wound.  · You notice a change in the color of your skin near your wound.  · You need to change the bandage often because fluid, blood, or pus is coming from the wound.  · You start to have a new rash.  · You start to have numbness around the wound.  GET HELP RIGHT AWAY IF:  · You have very bad swelling around the wound.  · Your pain suddenly gets worse and is very bad.  · You notice painful lumps near the wound or on skin that is anywhere on your body.  · You have a red streak going away from your wound.  · The wound is on your hand or foot and you cannot move a finger or toe like you usually can.  · The wound is on your hand or foot and you notice that your fingers or toes look pale or bluish.     This information is not intended to replace advice given to you by your health care provider. Make sure you discuss any questions you have with your health care provider.     Document Released: 02/12/2008 Document Revised: 01/10/2015 Document Reviewed: 08/22/2014  Elsevier Interactive Patient Education ©2016 [employer]

## 2016-06-18 DIAGNOSIS — L570 Actinic keratosis: Secondary | ICD-10-CM | POA: Diagnosis not present

## 2016-06-18 DIAGNOSIS — Z85828 Personal history of other malignant neoplasm of skin: Secondary | ICD-10-CM | POA: Diagnosis not present

## 2016-06-18 DIAGNOSIS — L821 Other seborrheic keratosis: Secondary | ICD-10-CM | POA: Diagnosis not present

## 2016-09-04 DIAGNOSIS — L821 Other seborrheic keratosis: Secondary | ICD-10-CM | POA: Diagnosis not present

## 2016-09-04 DIAGNOSIS — L570 Actinic keratosis: Secondary | ICD-10-CM | POA: Diagnosis not present

## 2017-01-07 ENCOUNTER — Ambulatory Visit (HOSPITAL_COMMUNITY)
Admission: EM | Admit: 2017-01-07 | Discharge: 2017-01-07 | Disposition: A | Discharge status: Home / Self Care | Payer: BLUE CROSS/BLUE SHIELD | Attending: Family Medicine | Admitting: Family Medicine

## 2017-01-07 ENCOUNTER — Encounter (HOSPITAL_COMMUNITY): Payer: Self-pay | Admitting: Family Medicine

## 2017-01-07 DIAGNOSIS — S81812A Laceration without foreign body, left lower leg, initial encounter: Secondary | ICD-10-CM

## 2017-01-07 MED ORDER — BACITRACIN ZINC 500 UNIT/GM EX OINT
TOPICAL_OINTMENT | CUTANEOUS | Status: AC
  Filled 2017-01-07: qty 0.9

## 2017-01-07 NOTE — ED Triage Notes (Signed)
Pt here for laceration to RLE. sts he cut on a tiller. About 1 1/2 inch laceration.

## 2017-01-07 NOTE — ED Provider Notes (Signed)
CSN: [id number]     Arrival date & time 01/07/17  1727 History   None    Chief Complaint  Patient presents with  . Laceration   (Consider location/radiation/quality/duration/timing/severity/associated sxs/prior Treatment) Patient c/o laceration left leg.  He cut his leg on a tiller.  He is UTD with tetanus.   The history is provided by the patient.  Laceration  Location:  Leg Leg laceration location:  L lower leg Depth:  Through dermis Quality: jagged   Bleeding: venous   Time since incident:  2 hours Laceration mechanism:  Metal edge Pain details:    Quality:  Aching   Severity:  Mild   Timing:  Constant   Progression:  Worsening Foreign body present:  Unable to specify Relieved by:  Nothing Worsened by:  Nothing Ineffective treatments:  None tried Tetanus status:  Up to date   Past Medical History:  Diagnosis Date  . Cancer ([place])   . Head and neck cancer   . History of radiation therapy 04/04/08-05/24/08   tongue,   Past Surgical History:  Procedure Laterality Date  . HERNIA REPAIR    . hx knee surgery     Family History  Problem Relation Age of Onset  . Cancer Mother   . Cancer Father   . Cancer Sister    Social History  Substance Use Topics  . Smoking status: Never Smoker  . Smokeless tobacco: Never Used  . Alcohol use Yes     Comment: Daily alcohol    Review of Systems  Constitutional: Negative.   HENT: Negative.   Eyes: Negative.   Respiratory: Negative.   Cardiovascular: Negative.   Gastrointestinal: Negative.   Endocrine: Negative.   Genitourinary: Negative.   Musculoskeletal: Negative.   Skin: Positive for wound.  Allergic/Immunologic: Negative.   Neurological: Negative.   Hematological: Negative.   Psychiatric/Behavioral: Negative.     Allergies  Patient has no known allergies.  Home Medications   Prior to Admission medications   Medication Sig Start Date End Date Taking? Authorizing Provider  ibuprofen (ADVIL,MOTRIN) 200 MG  tablet Take 800 mg by mouth every 6 (six) hours as needed.    Historical Provider, MD  naproxen sodium (ANAPROX) 220 MG tablet Take 220 mg by mouth 2 (two) times daily with a meal.    Historical Provider, MD  temazepam (RESTORIL) 15 MG capsule Take 15 mg by mouth at bedtime as needed.      Historical Provider, MD   Meds Ordered and Administered this Visit  Medications - No data to display  BP (!) 99/58   Pulse (!) 49   Temp 98.3 F (36.8 C)   Resp 18   SpO2 100%  No data found.   Physical Exam  Constitutional: He is oriented to person, place, and time. He appears well-developed and well-nourished.  HENT:  Head: Normocephalic and atraumatic.  Eyes: Conjunctivae and EOM are normal. Pupils are equal, round, and reactive to light.  Neck: Normal range of motion. Neck supple.  Cardiovascular: Normal rate, regular rhythm and normal heart sounds.   Pulmonary/Chest: Effort normal and breath sounds normal.  Neurological: He is alert and oriented to person, place, and time.  Nursing note and vitals reviewed.   Urgent Care Course     .Marland KitchenLaceration Repair Date/Time: 01/07/2017 6:33 PM Performed by: Lysbeth Penner Authorized by: Lysbeth Penner   Consent:    Consent obtained:  Verbal   Consent given by:  Patient   Risks discussed:  Infection and  pain   Alternatives discussed:  No treatment Anesthesia (see MAR for exact dosages):    Anesthesia method:  Local infiltration   Local anesthetic:  Lidocaine 2% WITH epi Laceration details:    Location:  Leg   Length (cm):  7   Depth (mm):  3 Repair type:    Repair type:  Intermediate Pre-procedure details:    Preparation:  Patient was prepped and draped in usual sterile fashion Exploration:    Hemostasis achieved with:  Direct pressure   Wound exploration: wound explored through full range of motion     Wound extent: areolar tissue violated     Contaminated: no   Treatment:    Area cleansed with:  Betadine and saline   Amount  of cleaning:  Standard   Irrigation solution:  Sterile saline   Irrigation volume:  50   Irrigation method:  Syringe   Visualized foreign bodies/material removed: no   Skin repair:    Repair method:  Sutures   Suture size:  3-0   Number of sutures:  7 Approximation:    Approximation:  Close   Vermilion border: well-aligned   Post-procedure details:    Dressing:  Antibiotic ointment, non-adherent dressing and adhesive bandage   Patient tolerance of procedure:  Tolerated well, no immediate complications   (including critical care time)  Labs Review Labs Reviewed - No data to display  Imaging Review No results found.   Visual Acuity Review  Right Eye Distance:   Left Eye Distance:   Bilateral Distance:    Right Eye Near:   Left Eye Near:    Bilateral Near:         MDM   1. Laceration of left lower extremity, initial encounter    3 vertical sutures and 4 simple sutures Follow up in 10-14 days for suture removal'     William J Oxford, FNP 01/07/17 1835

## 2017-01-07 NOTE — Discharge Instructions (Signed)
Follow up in 10 days for suture removal.

## 2017-02-12 ENCOUNTER — Other Ambulatory Visit: Payer: Self-pay | Admitting: *Deleted

## 2017-02-12 DIAGNOSIS — I712 Thoracic aortic aneurysm, without rupture, unspecified: Secondary | ICD-10-CM

## 2017-03-05 ENCOUNTER — Other Ambulatory Visit: Payer: BLUE CROSS/BLUE SHIELD

## 2017-03-05 ENCOUNTER — Encounter: Payer: BLUE CROSS/BLUE SHIELD | Admitting: Cardiothoracic Surgery

## 2017-06-17 DIAGNOSIS — L821 Other seborrheic keratosis: Secondary | ICD-10-CM | POA: Diagnosis not present

## 2017-06-17 DIAGNOSIS — L570 Actinic keratosis: Secondary | ICD-10-CM | POA: Diagnosis not present

## 2017-06-17 DIAGNOSIS — M6740 Ganglion, unspecified site: Secondary | ICD-10-CM | POA: Diagnosis not present

## 2017-06-17 DIAGNOSIS — Z85828 Personal history of other malignant neoplasm of skin: Secondary | ICD-10-CM | POA: Diagnosis not present

## 2017-12-29 ENCOUNTER — Other Ambulatory Visit (INDEPENDENT_AMBULATORY_CARE_PROVIDER_SITE_OTHER): Payer: Self-pay | Admitting: Orthopedic Surgery

## 2017-12-29 MED ORDER — PREDNISONE 10 MG PO TABS: 20.0000 mg | ORAL_TABLET | Freq: Every day | ORAL | 0 refills | Status: DC

## 2017-12-29 NOTE — Progress Notes (Signed)
Patient called today.  He is having lower back pain with radicular symptoms down his leg and radiating into the groin.  Patient's pain is worse with driving and sitting for prolonged periods of time no hip pain with ambulation.  Discussed with the patient we will call in a prescription for low-dose prednisone he will wean off this follow-up in 3 weeks and evaluate for possible MRI scan of the lumbar spine.

## 2018-01-19 ENCOUNTER — Encounter (INDEPENDENT_AMBULATORY_CARE_PROVIDER_SITE_OTHER): Payer: Self-pay | Admitting: Orthopedic Surgery

## 2018-01-19 ENCOUNTER — Ambulatory Visit (INDEPENDENT_AMBULATORY_CARE_PROVIDER_SITE_OTHER): Payer: BLUE CROSS/BLUE SHIELD

## 2018-01-19 ENCOUNTER — Ambulatory Visit (INDEPENDENT_AMBULATORY_CARE_PROVIDER_SITE_OTHER): Payer: BLUE CROSS/BLUE SHIELD | Admitting: Orthopedic Surgery

## 2018-01-19 DIAGNOSIS — M5416 Radiculopathy, lumbar region: Secondary | ICD-10-CM | POA: Diagnosis not present

## 2018-01-19 DIAGNOSIS — IMO0001 Reserved for inherently not codable concepts without codable children: Secondary | ICD-10-CM

## 2018-01-19 NOTE — Progress Notes (Signed)
Office Visit Note   Patient: Clarence Stark           Date of Birth: 04-21-1961           MRN: [id number] Visit Date: 01/19/2018              Requested by: Lavone Orn, MD [address] [place], [place] [address] PCP: Lavone Orn, MD  Chief Complaint  Patient presents with  . Lower Back - Pain      HPI: Examination patient is reporting a history of right-sided radicular pain that radiates down into his groin that was exacerbated by yoga where they were doing extension exercises about 4 weeks ago.  Patient was started on a prednisone Dosepak at 20 mg with breakfast every day and his symptoms are better.  Patient states he cannot sit he could not drive in a car down to [place].  Pain is better with standing.  Assessment & Plan: Visit Diagnoses:  1. Radicular pain of right lower back     Plan: We will request an MRI scan of his lumbar spine and anticipate he would benefit from a epidural steroid injection.  We will follow-up after the MRI scan is approved.  Follow-Up Instructions: Return if symptoms worsen or fail to improve.   Ortho Exam  Patient is alert, oriented, no adenopathy, well-dressed, normal affect, normal respiratory effort. Examination patient has a normal gait.  There is no pain with range of motion of the hip knees or ankles.  He has no focal motor weakness in either lower extremity.  Negative straight leg raise.  Patient's right-sided radicular pain is exacerbated by sitting.  Imaging: Xr Lumbar Spine 2-3 Views  Result Date: 01/19/2018 2 view radiographs of lumbar spine shows osteophytic bone spurs throughout the lumbar spine worse at L1-L2.  AP radiograph shows a mild degenerative scoliosis.  Hips show no significant pathology.  No images are attached to the encounter.  Labs: No results found for: HGBA1C, ESRSEDRATE, CRP, LABURIC, REPTSTATUS, GRAMSTAIN, CULT, LABORGA   Lab Results  Component Value Date   ALBUMIN 3.7 07/13/2009   ALBUMIN 4.0 05/13/2008   ALBUMIN 3.1 (L) 04/29/2008    There is no height or weight on file to calculate BMI.  Orders:  Orders Placed This Encounter  Procedures  . XR Lumbar Spine 2-3 Views   No orders of the defined types were placed in this encounter.    Procedures: No procedures performed  Clinical Data: No additional findings.  ROS:  All other systems negative, except as noted in the HPI. Review of Systems  Objective: Vital Signs: There were no vitals taken for this visit.  Specialty Comments:  No specialty comments available.  PMFS History: Patient Active Problem List   Diagnosis Date Noted  . Cancer of base of tongue ([place]) 08/04/2012  . CHRONIC LYMPHADENITIS 02/16/2008  . TONSILLITIS 02/16/2008  . INSOMNIA 10/13/2007  . ANXIETY 04/29/2007   Past Medical History:  Diagnosis Date  . Cancer ([place])   . Head and neck cancer   . History of radiation therapy 04/04/08-05/24/08   tongue,    Family History  Problem Relation Age of Onset  . Cancer Mother   . Cancer Father   . Cancer Sister     Past Surgical History:  Procedure Laterality Date  . HERNIA REPAIR    . hx knee surgery     Social History   Occupational History  . Not on file  Tobacco Use  .  Smoking status: Never Smoker  . Smokeless tobacco: Never Used  Substance and Sexual Activity  . Alcohol use: Yes    Comment: Daily alcohol  . Drug use: No  . Sexual activity: Not on file

## 2018-01-25 ENCOUNTER — Other Ambulatory Visit (INDEPENDENT_AMBULATORY_CARE_PROVIDER_SITE_OTHER): Payer: Self-pay | Admitting: Orthopedic Surgery

## 2018-01-26 ENCOUNTER — Ambulatory Visit
Admission: RE | Admit: 2018-01-26 | Discharge: 2018-01-26 | Disposition: A | Discharge status: Home / Self Care | Payer: BLUE CROSS/BLUE SHIELD | Source: Ambulatory Visit | Attending: Orthopedic Surgery | Admitting: Orthopedic Surgery

## 2018-01-26 DIAGNOSIS — M48061 Spinal stenosis, lumbar region without neurogenic claudication: Secondary | ICD-10-CM | POA: Diagnosis not present

## 2018-01-26 DIAGNOSIS — IMO0001 Reserved for inherently not codable concepts without codable children: Secondary | ICD-10-CM

## 2018-01-27 NOTE — Telephone Encounter (Signed)
Pharmacy  [facility]  [address] [place] [address]  [phone]   Med refill  Prednisone

## 2018-01-28 ENCOUNTER — Other Ambulatory Visit (INDEPENDENT_AMBULATORY_CARE_PROVIDER_SITE_OTHER): Payer: Self-pay

## 2018-01-28 DIAGNOSIS — IMO0001 Reserved for inherently not codable concepts without codable children: Secondary | ICD-10-CM

## 2018-01-28 NOTE — Telephone Encounter (Signed)
Do you want to refill? 

## 2018-02-03 DIAGNOSIS — Z125 Encounter for screening for malignant neoplasm of prostate: Secondary | ICD-10-CM | POA: Diagnosis not present

## 2018-02-03 DIAGNOSIS — Z8639 Personal history of other endocrine, nutritional and metabolic disease: Secondary | ICD-10-CM | POA: Diagnosis not present

## 2018-02-03 DIAGNOSIS — F5104 Psychophysiologic insomnia: Secondary | ICD-10-CM | POA: Diagnosis not present

## 2018-02-03 DIAGNOSIS — M545 Low back pain: Secondary | ICD-10-CM | POA: Diagnosis not present

## 2018-02-03 DIAGNOSIS — Z0000 Encounter for general adult medical examination without abnormal findings: Secondary | ICD-10-CM | POA: Diagnosis not present

## 2018-02-09 ENCOUNTER — Ambulatory Visit (INDEPENDENT_AMBULATORY_CARE_PROVIDER_SITE_OTHER): Payer: BLUE CROSS/BLUE SHIELD | Admitting: Orthopedic Surgery

## 2018-02-23 ENCOUNTER — Encounter (INDEPENDENT_AMBULATORY_CARE_PROVIDER_SITE_OTHER): Payer: Self-pay | Admitting: Physical Medicine and Rehabilitation

## 2018-03-24 ENCOUNTER — Encounter (INDEPENDENT_AMBULATORY_CARE_PROVIDER_SITE_OTHER): Payer: Self-pay | Admitting: Physical Medicine and Rehabilitation

## 2018-04-07 ENCOUNTER — Ambulatory Visit (INDEPENDENT_AMBULATORY_CARE_PROVIDER_SITE_OTHER): Payer: BLUE CROSS/BLUE SHIELD | Admitting: Physical Medicine and Rehabilitation

## 2018-04-07 DIAGNOSIS — M5116 Intervertebral disc disorders with radiculopathy, lumbar region: Secondary | ICD-10-CM | POA: Diagnosis not present

## 2018-04-07 DIAGNOSIS — M545 Low back pain, unspecified: Secondary | ICD-10-CM

## 2018-04-07 DIAGNOSIS — S39012D Strain of muscle, fascia and tendon of lower back, subsequent encounter: Secondary | ICD-10-CM

## 2018-04-07 NOTE — Progress Notes (Signed)
   Numeric Pain Rating Scale and Functional Assessment Average Pain 2   In the last MONTH (on 0-10 scale) has pain interfered with the following?  1. General activity like being  able to carry out your everyday physical activities such as walking, climbing stairs, carrying groceries, or moving a chair?  Rating(10)   -Driver, -BT, -Dye Allergies.

## 2018-04-15 ENCOUNTER — Encounter (INDEPENDENT_AMBULATORY_CARE_PROVIDER_SITE_OTHER): Payer: Self-pay | Admitting: Physical Medicine and Rehabilitation

## 2018-04-15 NOTE — Progress Notes (Signed)
Clarence Stark - 57 y.o. male MRN [id number]  Date of birth: 15-Jul-1961  Office Visit Note: Visit Date: 04/07/2018 PCP: Lavone Orn, MD Referred by: Lavone Orn, MD  Subjective: Chief Complaint  Patient presents with  . Lower Back - Pain   HPI: Clarence Stark is a 57 year old gentleman referred here by Dr. Meridee Score for evaluation management of his low back and what was right radicular type buttock pain.  The patient reports that sometime in April he had gone to yoga class which he refers to as a yoga class is not very strenuous and they did do some extension exercises in the next day he had excruciating pain in the lower back into the right buttock that was more painful with sitting than standing or walking.  He reports that he was initially given 20 mg of prednisone daily and this seemed to reduce the symptoms somewhat but he still had difficulty even sitting in the car for any length of time.  He had a trip to [place] but he just could not hardly make it because of the pain with sitting.  Dr. Sharol Given at that point did order MRI of the lumbar spine and the patient is here for review of that as well.  We did review with the patient using spine model and the images and this is reviewed below.  Today he is not really having much in the way of pain his average pain right now is a 2 out of 10.  The spasming type pain was very severe when it was there but this essentially has gone away.  He denies any bowel bladder changes or focal weakness.  He still pretty active with swimming and exercising.   Review of Systems  Constitutional: Negative for chills, fever, malaise/fatigue and weight loss.  HENT: Negative for hearing loss and sinus pain.   Eyes: Negative for blurred vision, double vision and photophobia.  Respiratory: Negative for cough and shortness of breath.   Cardiovascular: Negative for chest pain, palpitations and leg swelling.  Gastrointestinal: Negative for abdominal pain, nausea and  vomiting.  Genitourinary: Negative for flank pain.  Musculoskeletal: Positive for back pain. Negative for myalgias.  Skin: Negative for itching and rash.  Neurological: Negative for tremors, focal weakness and weakness.  Endo/Heme/Allergies: Negative.   Psychiatric/Behavioral: Negative for depression.  All other systems reviewed and are negative.  Otherwise per HPI.  Assessment & Plan: Visit Diagnoses:  1. Strain of lumbar region, subsequent encounter   2. Right-sided low back pain without sciatica, unspecified chronicity   3. Radiculopathy due to lumbar intervertebral disc disorder     Plan: Findings:  Recent significant exacerbation of what could have been a radicular pain from sprain strain of the lumbar spine which we typically think is may be small annular tear or small disc protrusion.  He is essentially recovered from this with the use of prednisone for a few days and time and rest.  He is pretty active and still swims and exercises and does some biking which he really has not done recently.  MRI findings are fairly mild with mild scoliosis centered about L3 with compensatory curvature at L5 with some foraminal narrowing really more on the left than right.  He has some broad bulging of the disc but no compressive extrusions or herniations.  No focal nerve compression or stenosis.  Good disc heights overall.  We talked at length today about core strengthening as well as lifting and current exercises that he is  performing.  I think he is doing everything right this is essentially what I refer to his tweaking the spine and it probably was related to the yoga class and something very minor can cause a pretty severe spasm and even trigger point related to nerve root irritation.  The good news is there is no focal compression anywhere to be seen.  He has no weakness.  At this point just watchful waiting and continued with increasing activity levels with commonsense in terms of pain level and what  he is doing.  They have a flareup of this from time to time and obviously could treat that potentially with injection or oral steroids just as before.  Greater than 50% of this visit (total duration of visit was 20 minutes) ) was spent in counseling and coordination of care discussing above issues.    Meds & Orders: No orders of the defined types were placed in this encounter.  No orders of the defined types were placed in this encounter.   Follow-up: Return if symptoms worsen or fail to improve.   Procedures: No procedures performed  No notes on file   Clinical History: MRI LUMBAR SPINE WITHOUT CONTRAST  TECHNIQUE: Multiplanar, multisequence MR imaging of the lumbar spine was performed. No intravenous contrast was administered.  COMPARISON:  None.  FINDINGS: Segmentation: 5 non rib-bearing lumbar type vertebral bodies are present.  Alignment: AP alignment is anatomic. Leftward curvature is centered at L3. Compensatory rightward curvature is present at L5.  Vertebrae: Edematous endplate marrow changes are present on the right at L2-3. Marrow signal and vertebral body heights are otherwise normal.  Conus medullaris and cauda equina: Conus extends to the L1 level. Conus and cauda equina appear normal.  Paraspinal and other soft tissues: Limited imaging the abdomen is unremarkable. Paraspinous soft tissues are unremarkable.  Disc levels:  L1-2: Mild disc bulging and facet hypertrophy is present. There is no significant stenosis.  L2-3: Moderate facet hypertrophy is present bilaterally. A broad-based disc protrusion is present. The central canal is patent. Mild right foraminal narrowing is noted.  L3-4: Asymmetric advanced right-sided facet hypertrophy is present. Broad-based disc protrusion is present. The central canal is patent. Mild foraminal narrowing is present on the right.  L4-5: A broad-based disc protrusion is present. Advanced facet hypertrophy  is noted. The central canal is patent. Mild foraminal narrowing is worse on the left.  L5-S1: A leftward disc protrusion is present. Mild facet hypertrophy is worse on the left. The central canal is patent. Moderate left foraminal stenosis is present.  IMPRESSION: 1. Moderate left foraminal stenosis at L5-S1 secondary to rightward scoliosis and a left-sided disc protrusion. 2. Mild foraminal narrowing bilaterally at L4-5 is worse on the left. 3. Mild right foraminal narrowing at L2-3 and L3-4 secondary to scoliosis and disc disease.   Electronically Signed   By: San Morelle M.D.   On: 01/26/2018 09:06   He reports that he has never smoked. He has never used smokeless tobacco. No results for input(s): HGBA1C, LABURIC in the last 8760 hours.  Objective:  VS:  HT:    WT:   BMI:     BP:   HR: bpm  TEMP: ( )  RESP:  Physical Exam  Constitutional: He is oriented to person, place, and time. He appears well-developed and well-nourished. No distress.  HENT:  Head: Normocephalic and atraumatic.  Eyes: Pupils are equal, round, and reactive to light. Conjunctivae are normal.  Neck: Normal range of motion. Neck supple.  Cardiovascular: Regular rhythm and intact distal pulses.  Pulmonary/Chest: Effort normal. No respiratory distress.  Musculoskeletal:  Patient stands with a slightly forward flexed lumbar spine with no real pain with extension rotation.  He has some pain with palpation of the quadratus lumborum right more than left.  No pain over the PSIS.  He has good distal strength without deficit and no pain with hip rotation.  Neurological: He is alert and oriented to person, place, and time. He exhibits normal muscle tone. Coordination normal.  Skin: Skin is warm and dry. No rash noted. No erythema.  Psychiatric: He has a normal mood and affect.  Nursing note and vitals reviewed.   Ortho Exam Imaging: No results found.  Past Medical/Family/Surgical/Social  History: Medications & Allergies reviewed per EMR, new medications updated. Patient Active Problem List   Diagnosis Date Noted  . Cancer of base of tongue ([place]) 08/04/2012  . CHRONIC LYMPHADENITIS 02/16/2008  . TONSILLITIS 02/16/2008  . INSOMNIA 10/13/2007  . ANXIETY 04/29/2007   Past Medical History:  Diagnosis Date  . Cancer ([place])   . Head and neck cancer   . History of radiation therapy 04/04/08-05/24/08   tongue,   Family History  Problem Relation Age of Onset  . Cancer Mother   . Cancer Father   . Cancer Sister    Past Surgical History:  Procedure Laterality Date  . HERNIA REPAIR    . hx knee surgery     Social History   Occupational History  . Not on file  Tobacco Use  . Smoking status: Never Smoker  . Smokeless tobacco: Never Used  Substance and Sexual Activity  . Alcohol use: Yes    Comment: Daily alcohol  . Drug use: No  . Sexual activity: Not on file

## 2018-04-27 DIAGNOSIS — H5203 Hypermetropia, bilateral: Secondary | ICD-10-CM | POA: Diagnosis not present

## 2018-04-27 DIAGNOSIS — D23111 Other benign neoplasm of skin of right upper eyelid, including canthus: Secondary | ICD-10-CM | POA: Diagnosis not present

## 2018-06-23 DIAGNOSIS — D485 Neoplasm of uncertain behavior of skin: Secondary | ICD-10-CM | POA: Diagnosis not present

## 2018-06-23 DIAGNOSIS — D1801 Hemangioma of skin and subcutaneous tissue: Secondary | ICD-10-CM | POA: Diagnosis not present

## 2018-06-23 DIAGNOSIS — L570 Actinic keratosis: Secondary | ICD-10-CM | POA: Diagnosis not present

## 2018-06-23 DIAGNOSIS — Z85828 Personal history of other malignant neoplasm of skin: Secondary | ICD-10-CM | POA: Diagnosis not present

## 2019-02-18 DIAGNOSIS — C44329 Squamous cell carcinoma of skin of other parts of face: Secondary | ICD-10-CM | POA: Diagnosis not present

## 2019-02-18 DIAGNOSIS — L821 Other seborrheic keratosis: Secondary | ICD-10-CM | POA: Diagnosis not present

## 2019-02-18 DIAGNOSIS — D2261 Melanocytic nevi of right upper limb, including shoulder: Secondary | ICD-10-CM | POA: Diagnosis not present

## 2019-02-18 DIAGNOSIS — C4441 Basal cell carcinoma of skin of scalp and neck: Secondary | ICD-10-CM | POA: Diagnosis not present

## 2019-02-18 DIAGNOSIS — L570 Actinic keratosis: Secondary | ICD-10-CM | POA: Diagnosis not present

## 2019-02-18 DIAGNOSIS — Z85828 Personal history of other malignant neoplasm of skin: Secondary | ICD-10-CM | POA: Diagnosis not present

## 2019-08-12 DIAGNOSIS — C44619 Basal cell carcinoma of skin of left upper limb, including shoulder: Secondary | ICD-10-CM | POA: Diagnosis not present

## 2019-08-12 DIAGNOSIS — D225 Melanocytic nevi of trunk: Secondary | ICD-10-CM | POA: Diagnosis not present

## 2019-08-12 DIAGNOSIS — L570 Actinic keratosis: Secondary | ICD-10-CM | POA: Diagnosis not present

## 2019-08-12 DIAGNOSIS — Z85828 Personal history of other malignant neoplasm of skin: Secondary | ICD-10-CM | POA: Diagnosis not present

## 2019-08-12 DIAGNOSIS — B070 Plantar wart: Secondary | ICD-10-CM | POA: Diagnosis not present

## 2019-09-08 DIAGNOSIS — Z20828 Contact with and (suspected) exposure to other viral communicable diseases: Secondary | ICD-10-CM | POA: Diagnosis not present

## 2019-09-08 DIAGNOSIS — Z03818 Encounter for observation for suspected exposure to other biological agents ruled out: Secondary | ICD-10-CM | POA: Diagnosis not present

## 2020-02-01 ENCOUNTER — Ambulatory Visit
Admission: RE | Admit: 2020-02-01 | Discharge: 2020-02-01 | Disposition: A | Discharge status: Home / Self Care | Payer: BLUE CROSS/BLUE SHIELD | Source: Ambulatory Visit | Attending: Internal Medicine | Admitting: Internal Medicine

## 2020-02-01 ENCOUNTER — Other Ambulatory Visit: Payer: Self-pay | Admitting: Internal Medicine

## 2020-02-01 DIAGNOSIS — Z8639 Personal history of other endocrine, nutritional and metabolic disease: Secondary | ICD-10-CM | POA: Diagnosis not present

## 2020-02-01 DIAGNOSIS — M25551 Pain in right hip: Secondary | ICD-10-CM | POA: Diagnosis not present

## 2020-02-01 DIAGNOSIS — Z1159 Encounter for screening for other viral diseases: Secondary | ICD-10-CM | POA: Diagnosis not present

## 2020-02-01 DIAGNOSIS — Z125 Encounter for screening for malignant neoplasm of prostate: Secondary | ICD-10-CM | POA: Diagnosis not present

## 2020-02-01 DIAGNOSIS — Z13220 Encounter for screening for lipoid disorders: Secondary | ICD-10-CM | POA: Diagnosis not present

## 2020-02-01 DIAGNOSIS — Z0000 Encounter for general adult medical examination without abnormal findings: Secondary | ICD-10-CM | POA: Diagnosis not present

## 2020-02-16 DIAGNOSIS — S51812A Laceration without foreign body of left forearm, initial encounter: Secondary | ICD-10-CM | POA: Diagnosis not present

## 2020-05-09 DIAGNOSIS — L821 Other seborrheic keratosis: Secondary | ICD-10-CM | POA: Diagnosis not present

## 2020-05-09 DIAGNOSIS — C44519 Basal cell carcinoma of skin of other part of trunk: Secondary | ICD-10-CM | POA: Diagnosis not present

## 2020-05-09 DIAGNOSIS — L814 Other melanin hyperpigmentation: Secondary | ICD-10-CM | POA: Diagnosis not present

## 2020-05-09 DIAGNOSIS — L570 Actinic keratosis: Secondary | ICD-10-CM | POA: Diagnosis not present

## 2020-05-09 DIAGNOSIS — Z85828 Personal history of other malignant neoplasm of skin: Secondary | ICD-10-CM | POA: Diagnosis not present

## 2020-11-08 DIAGNOSIS — D225 Melanocytic nevi of trunk: Secondary | ICD-10-CM | POA: Diagnosis not present

## 2020-11-08 DIAGNOSIS — D0461 Carcinoma in situ of skin of right upper limb, including shoulder: Secondary | ICD-10-CM | POA: Diagnosis not present

## 2020-11-08 DIAGNOSIS — Z85828 Personal history of other malignant neoplasm of skin: Secondary | ICD-10-CM | POA: Diagnosis not present

## 2020-11-08 DIAGNOSIS — L570 Actinic keratosis: Secondary | ICD-10-CM | POA: Diagnosis not present

## 2021-02-01 DIAGNOSIS — E041 Nontoxic single thyroid nodule: Secondary | ICD-10-CM | POA: Diagnosis not present

## 2021-02-01 DIAGNOSIS — Z0000 Encounter for general adult medical examination without abnormal findings: Secondary | ICD-10-CM | POA: Diagnosis not present

## 2021-02-01 DIAGNOSIS — Z125 Encounter for screening for malignant neoplasm of prostate: Secondary | ICD-10-CM | POA: Diagnosis not present

## 2021-02-08 DIAGNOSIS — Z23 Encounter for immunization: Secondary | ICD-10-CM | POA: Diagnosis not present

## 2021-03-05 DIAGNOSIS — N281 Cyst of kidney, acquired: Secondary | ICD-10-CM | POA: Diagnosis not present

## 2021-03-05 DIAGNOSIS — R10816 Epigastric abdominal tenderness: Secondary | ICD-10-CM | POA: Diagnosis not present

## 2021-03-05 DIAGNOSIS — R1013 Epigastric pain: Secondary | ICD-10-CM | POA: Diagnosis not present

## 2021-04-18 DIAGNOSIS — Z23 Encounter for immunization: Secondary | ICD-10-CM | POA: Diagnosis not present

## 2021-07-13 DIAGNOSIS — M545 Low back pain, unspecified: Secondary | ICD-10-CM | POA: Diagnosis not present

## 2022-11-18 DIAGNOSIS — L570 Actinic keratosis: Secondary | ICD-10-CM | POA: Diagnosis not present

## 2022-11-18 DIAGNOSIS — D225 Melanocytic nevi of trunk: Secondary | ICD-10-CM | POA: Diagnosis not present

## 2022-11-18 DIAGNOSIS — Z85828 Personal history of other malignant neoplasm of skin: Secondary | ICD-10-CM | POA: Diagnosis not present

## 2022-11-18 DIAGNOSIS — C44519 Basal cell carcinoma of skin of other part of trunk: Secondary | ICD-10-CM | POA: Diagnosis not present

## 2022-11-18 DIAGNOSIS — C44729 Squamous cell carcinoma of skin of left lower limb, including hip: Secondary | ICD-10-CM | POA: Diagnosis not present

## 2023-02-25 DIAGNOSIS — L820 Inflamed seborrheic keratosis: Secondary | ICD-10-CM | POA: Diagnosis not present

## 2023-02-25 DIAGNOSIS — L570 Actinic keratosis: Secondary | ICD-10-CM | POA: Diagnosis not present

## 2023-06-04 DIAGNOSIS — L570 Actinic keratosis: Secondary | ICD-10-CM | POA: Diagnosis not present

## 2023-06-04 DIAGNOSIS — D225 Melanocytic nevi of trunk: Secondary | ICD-10-CM | POA: Diagnosis not present

## 2023-06-04 DIAGNOSIS — Z85828 Personal history of other malignant neoplasm of skin: Secondary | ICD-10-CM | POA: Diagnosis not present

## 2023-06-04 DIAGNOSIS — L821 Other seborrheic keratosis: Secondary | ICD-10-CM | POA: Diagnosis not present

## 2023-06-19 DIAGNOSIS — J329 Chronic sinusitis, unspecified: Secondary | ICD-10-CM | POA: Diagnosis not present

## 2023-11-12 DIAGNOSIS — I77810 Thoracic aortic ectasia: Secondary | ICD-10-CM | POA: Diagnosis not present

## 2023-11-12 DIAGNOSIS — Z125 Encounter for screening for malignant neoplasm of prostate: Secondary | ICD-10-CM | POA: Diagnosis not present

## 2023-11-12 DIAGNOSIS — R946 Abnormal results of thyroid function studies: Secondary | ICD-10-CM | POA: Diagnosis not present

## 2023-11-12 DIAGNOSIS — Z0000 Encounter for general adult medical examination without abnormal findings: Secondary | ICD-10-CM | POA: Diagnosis not present

## 2023-11-12 DIAGNOSIS — Z7289 Other problems related to lifestyle: Secondary | ICD-10-CM | POA: Diagnosis not present

## 2023-11-12 DIAGNOSIS — F1090 Alcohol use, unspecified, uncomplicated: Secondary | ICD-10-CM | POA: Diagnosis not present

## 2023-11-12 DIAGNOSIS — E7800 Pure hypercholesterolemia, unspecified: Secondary | ICD-10-CM | POA: Diagnosis not present

## 2023-11-12 DIAGNOSIS — J329 Chronic sinusitis, unspecified: Secondary | ICD-10-CM | POA: Diagnosis not present

## 2023-11-12 DIAGNOSIS — E041 Nontoxic single thyroid nodule: Secondary | ICD-10-CM | POA: Diagnosis not present

## 2023-11-12 DIAGNOSIS — Z79899 Other long term (current) drug therapy: Secondary | ICD-10-CM | POA: Diagnosis not present

## 2023-11-12 DIAGNOSIS — F5104 Psychophysiologic insomnia: Secondary | ICD-10-CM | POA: Diagnosis not present

## 2023-12-04 DIAGNOSIS — Z8589 Personal history of malignant neoplasm of other organs and systems: Secondary | ICD-10-CM | POA: Diagnosis not present

## 2023-12-04 DIAGNOSIS — J329 Chronic sinusitis, unspecified: Secondary | ICD-10-CM | POA: Diagnosis not present

## 2023-12-15 DIAGNOSIS — Z85828 Personal history of other malignant neoplasm of skin: Secondary | ICD-10-CM | POA: Diagnosis not present

## 2023-12-15 DIAGNOSIS — L814 Other melanin hyperpigmentation: Secondary | ICD-10-CM | POA: Diagnosis not present

## 2023-12-15 DIAGNOSIS — L570 Actinic keratosis: Secondary | ICD-10-CM | POA: Diagnosis not present

## 2023-12-15 DIAGNOSIS — D2262 Melanocytic nevi of left upper limb, including shoulder: Secondary | ICD-10-CM | POA: Diagnosis not present

## 2023-12-15 DIAGNOSIS — C44519 Basal cell carcinoma of skin of other part of trunk: Secondary | ICD-10-CM | POA: Diagnosis not present

## 2023-12-15 DIAGNOSIS — L821 Other seborrheic keratosis: Secondary | ICD-10-CM | POA: Diagnosis not present

## 2023-12-15 DIAGNOSIS — D2261 Melanocytic nevi of right upper limb, including shoulder: Secondary | ICD-10-CM | POA: Diagnosis not present

## 2023-12-15 DIAGNOSIS — L820 Inflamed seborrheic keratosis: Secondary | ICD-10-CM | POA: Diagnosis not present

## 2023-12-15 DIAGNOSIS — D225 Melanocytic nevi of trunk: Secondary | ICD-10-CM | POA: Diagnosis not present

## 2023-12-15 DIAGNOSIS — D485 Neoplasm of uncertain behavior of skin: Secondary | ICD-10-CM | POA: Diagnosis not present

## 2024-04-08 DIAGNOSIS — E7800 Pure hypercholesterolemia, unspecified: Secondary | ICD-10-CM | POA: Diagnosis not present

## 2024-05-03 DIAGNOSIS — F5104 Psychophysiologic insomnia: Secondary | ICD-10-CM | POA: Diagnosis not present

## 2024-05-03 DIAGNOSIS — E7800 Pure hypercholesterolemia, unspecified: Secondary | ICD-10-CM | POA: Diagnosis not present

## 2024-05-03 DIAGNOSIS — I77810 Thoracic aortic ectasia: Secondary | ICD-10-CM | POA: Diagnosis not present

## 2024-05-03 DIAGNOSIS — F1090 Alcohol use, unspecified, uncomplicated: Secondary | ICD-10-CM | POA: Diagnosis not present

## 2024-05-04 ENCOUNTER — Other Ambulatory Visit (HOSPITAL_BASED_OUTPATIENT_CLINIC_OR_DEPARTMENT_OTHER): Payer: Self-pay | Admitting: Internal Medicine

## 2024-05-04 DIAGNOSIS — E7800 Pure hypercholesterolemia, unspecified: Secondary | ICD-10-CM

## 2024-05-09 DIAGNOSIS — E7800 Pure hypercholesterolemia, unspecified: Secondary | ICD-10-CM | POA: Diagnosis not present

## 2024-05-25 ENCOUNTER — Ambulatory Visit (HOSPITAL_BASED_OUTPATIENT_CLINIC_OR_DEPARTMENT_OTHER)
Admission: RE | Admit: 2024-05-25 | Discharge: 2024-05-25 | Disposition: A | Discharge status: Home / Self Care | Payer: Self-pay | Source: Ambulatory Visit | Attending: Internal Medicine | Admitting: Internal Medicine

## 2024-05-25 DIAGNOSIS — E7800 Pure hypercholesterolemia, unspecified: Secondary | ICD-10-CM | POA: Insufficient documentation

## 2024-05-27 ENCOUNTER — Other Ambulatory Visit (HOSPITAL_BASED_OUTPATIENT_CLINIC_OR_DEPARTMENT_OTHER): Payer: Self-pay | Admitting: Internal Medicine

## 2024-05-27 DIAGNOSIS — I7121 Aneurysm of the ascending aorta, without rupture: Secondary | ICD-10-CM

## 2024-05-28 ENCOUNTER — Ambulatory Visit (HOSPITAL_BASED_OUTPATIENT_CLINIC_OR_DEPARTMENT_OTHER)
Admission: RE | Admit: 2024-05-28 | Discharge: 2024-05-28 | Disposition: A | Discharge status: Home / Self Care | Source: Ambulatory Visit | Attending: Internal Medicine | Admitting: Internal Medicine

## 2024-05-28 ENCOUNTER — Encounter (HOSPITAL_BASED_OUTPATIENT_CLINIC_OR_DEPARTMENT_OTHER): Payer: Self-pay

## 2024-05-28 DIAGNOSIS — E041 Nontoxic single thyroid nodule: Secondary | ICD-10-CM | POA: Diagnosis not present

## 2024-05-28 DIAGNOSIS — I7121 Aneurysm of the ascending aorta, without rupture: Secondary | ICD-10-CM | POA: Insufficient documentation

## 2024-05-28 MED ORDER — IOHEXOL 350 MG/ML SOLN
100.0000 mL | Freq: Once | INTRAVENOUS | Status: AC | PRN
  Administered 2024-05-28: 100 mL via INTRAVENOUS

## 2024-05-31 ENCOUNTER — Other Ambulatory Visit: Payer: Self-pay

## 2024-06-08 DIAGNOSIS — E7800 Pure hypercholesterolemia, unspecified: Secondary | ICD-10-CM | POA: Diagnosis not present

## 2024-06-16 ENCOUNTER — Encounter: Payer: Self-pay | Admitting: Surgery

## 2024-06-16 ENCOUNTER — Ambulatory Visit: Attending: Surgery | Admitting: Surgery

## 2024-06-16 VITALS — BP 145/87 | HR 62 | Resp 18 | Ht 75.0 in | Wt 210.0 lb

## 2024-06-16 DIAGNOSIS — I7121 Aneurysm of the ascending aorta, without rupture: Secondary | ICD-10-CM

## 2024-06-16 NOTE — Progress Notes (Unsigned)
   [address] [place] [address]             [phone]     Cardiothoracic Surgery Consultation   PCP is Charlott Dorn LABOR, MD Referring Provider is Charlott Dorn LABOR, *  Chief Complaint  Patient presents with  . Thoracic Aortic Aneurysm    CTA 9/19    HPI:   Past Medical History:  Diagnosis Date  . Cancer ([facility])   . Head and neck cancer   . History of radiation therapy 04/04/08-05/24/08   tongue,    Past Surgical History:  Procedure Laterality Date  . HERNIA REPAIR    . hx knee surgery      Family History  Problem Relation Age of Onset  . Cancer Mother   . Cancer Father   . Cancer Sister     Social History Social History   Tobacco Use  . Smoking status: Never  . Smokeless tobacco: Never  Substance Use Topics  . Alcohol use: Yes    Comment: Daily alcohol  . Drug use: No    Current Outpatient Medications  Medication Sig Dispense Refill  . ibuprofen (ADVIL,MOTRIN) 200 MG tablet Take 800 mg by mouth every 6 (six) hours as needed.    . predniSONE  (DELTASONE ) 10 MG tablet TAKE 2 TABLETS BY MOUTH DAILY WITH BREAKFAST 60 tablet 0  . naproxen sodium (ANAPROX) 220 MG tablet Take 220 mg by mouth 2 (two) times daily with a meal. (Patient not taking: Reported on 06/16/2024)    . temazepam (RESTORIL) 15 MG capsule Take 15 mg by mouth at bedtime as needed.   (Patient not taking: Reported on 06/16/2024)     No current facility-administered medications for this visit.    No Known Allergies  Review of Systems  BP (!) 145/87   Pulse 62   Resp 18   Ht 6' 3 (1.905 m)   Wt 210 lb (95.3 kg)   SpO2 99%   BMI 26.25 kg/m  Physical Exam   Diagnostic Tests:   Impression:   Plan:   Clarence MARLA Fellers, MD [facility] [phone]

## 2024-06-23 ENCOUNTER — Encounter: Admitting: Surgery

## 2024-07-06 DIAGNOSIS — L821 Other seborrheic keratosis: Secondary | ICD-10-CM | POA: Diagnosis not present

## 2024-07-06 DIAGNOSIS — L570 Actinic keratosis: Secondary | ICD-10-CM | POA: Diagnosis not present

## 2024-07-06 DIAGNOSIS — Z85828 Personal history of other malignant neoplasm of skin: Secondary | ICD-10-CM | POA: Diagnosis not present

## 2024-07-06 DIAGNOSIS — L918 Other hypertrophic disorders of the skin: Secondary | ICD-10-CM | POA: Diagnosis not present

## 2024-07-09 DIAGNOSIS — E7800 Pure hypercholesterolemia, unspecified: Secondary | ICD-10-CM | POA: Diagnosis not present
